# Patient Record
Sex: Female | Born: 1985 | Race: White | Hispanic: No | Marital: Married | Smoking: Current every day smoker
Health system: Southern US, Community
[De-identification: ages and names within clinical notes are randomized; demographics above are authoritative.]

## PROBLEM LIST (undated history)

## (undated) DIAGNOSIS — T8859XA Other complications of anesthesia, initial encounter: Secondary | ICD-10-CM

## (undated) DIAGNOSIS — T4145XA Adverse effect of unspecified anesthetic, initial encounter: Secondary | ICD-10-CM

## (undated) DIAGNOSIS — F1111 Opioid abuse, in remission: Secondary | ICD-10-CM

## (undated) DIAGNOSIS — S82892A Other fracture of left lower leg, initial encounter for closed fracture: Secondary | ICD-10-CM

## (undated) DIAGNOSIS — F410 Panic disorder [episodic paroxysmal anxiety] without agoraphobia: Secondary | ICD-10-CM

## (undated) DIAGNOSIS — R519 Headache, unspecified: Secondary | ICD-10-CM

## (undated) DIAGNOSIS — Z972 Presence of dental prosthetic device (complete) (partial): Secondary | ICD-10-CM

## (undated) DIAGNOSIS — F419 Anxiety disorder, unspecified: Secondary | ICD-10-CM

## (undated) DIAGNOSIS — M25572 Pain in left ankle and joints of left foot: Secondary | ICD-10-CM

## (undated) DIAGNOSIS — M25472 Effusion, left ankle: Secondary | ICD-10-CM

## (undated) DIAGNOSIS — R51 Headache: Secondary | ICD-10-CM

## (undated) HISTORY — PX: WISDOM TOOTH EXTRACTION: SHX21

---

## 2005-07-15 ENCOUNTER — Ambulatory Visit (HOSPITAL_COMMUNITY): Admission: RE | Admit: 2005-07-15 | Discharge: 2005-07-15 | Payer: Self-pay | Admitting: Gynecology

## 2005-07-31 ENCOUNTER — Ambulatory Visit (HOSPITAL_COMMUNITY): Admission: RE | Admit: 2005-07-31 | Discharge: 2005-07-31 | Payer: Self-pay | Admitting: Gynecology

## 2005-08-04 HISTORY — PX: TUBAL LIGATION: SHX77

## 2005-09-30 ENCOUNTER — Emergency Department: Payer: Self-pay | Admitting: Emergency Medicine

## 2005-12-08 ENCOUNTER — Observation Stay: Payer: Self-pay

## 2005-12-09 ENCOUNTER — Observation Stay: Payer: Self-pay | Admitting: Unknown Physician Specialty

## 2005-12-13 ENCOUNTER — Inpatient Hospital Stay: Payer: Self-pay | Admitting: Obstetrics & Gynecology

## 2006-02-05 ENCOUNTER — Ambulatory Visit: Payer: Self-pay | Admitting: Obstetrics & Gynecology

## 2006-10-12 ENCOUNTER — Ambulatory Visit: Payer: Self-pay | Admitting: Obstetrics & Gynecology

## 2006-10-15 ENCOUNTER — Ambulatory Visit: Payer: Self-pay | Admitting: Obstetrics & Gynecology

## 2007-01-19 ENCOUNTER — Ambulatory Visit: Payer: Self-pay | Admitting: Obstetrics & Gynecology

## 2008-10-22 ENCOUNTER — Emergency Department: Payer: Self-pay | Admitting: Emergency Medicine

## 2009-01-30 ENCOUNTER — Encounter: Payer: Self-pay | Admitting: General Practice

## 2009-02-01 ENCOUNTER — Encounter: Payer: Self-pay | Admitting: General Practice

## 2009-07-29 ENCOUNTER — Emergency Department: Payer: Self-pay | Admitting: Emergency Medicine

## 2010-07-04 ENCOUNTER — Emergency Department: Payer: Self-pay | Admitting: Emergency Medicine

## 2010-09-25 ENCOUNTER — Emergency Department: Payer: Self-pay | Admitting: Unknown Physician Specialty

## 2010-10-18 ENCOUNTER — Emergency Department: Payer: Self-pay | Admitting: Emergency Medicine

## 2011-01-08 ENCOUNTER — Encounter: Payer: Self-pay | Admitting: Internal Medicine

## 2011-03-02 ENCOUNTER — Emergency Department: Payer: Self-pay | Admitting: Emergency Medicine

## 2011-07-13 ENCOUNTER — Emergency Department: Payer: Self-pay | Admitting: Emergency Medicine

## 2012-10-10 ENCOUNTER — Emergency Department: Payer: Self-pay | Admitting: Emergency Medicine

## 2012-10-10 LAB — URINALYSIS, COMPLETE
Bilirubin,UR: NEGATIVE
Blood: NEGATIVE
Ketone: NEGATIVE
Ph: 5 (ref 4.5–8.0)
RBC,UR: 5 /HPF (ref 0–5)
Squamous Epithelial: 10
WBC UR: 5 /HPF (ref 0–5)

## 2012-10-10 LAB — COMPREHENSIVE METABOLIC PANEL
Albumin: 4 g/dL (ref 3.4–5.0)
Anion Gap: 7 (ref 7–16)
Bilirubin,Total: 0.4 mg/dL (ref 0.2–1.0)
Calcium, Total: 8.4 mg/dL — ABNORMAL LOW (ref 8.5–10.1)
Co2: 27 mmol/L (ref 21–32)
Osmolality: 278 (ref 275–301)
SGPT (ALT): 22 U/L (ref 12–78)
Sodium: 138 mmol/L (ref 136–145)

## 2012-10-10 LAB — CBC
MCHC: 34.8 g/dL (ref 32.0–36.0)
MCV: 84 fL (ref 80–100)
RBC: 4.87 10*6/uL (ref 3.80–5.20)
WBC: 8.3 10*3/uL (ref 3.6–11.0)

## 2012-10-10 LAB — LIPASE, BLOOD: Lipase: 65 U/L — ABNORMAL LOW (ref 73–393)

## 2012-10-12 ENCOUNTER — Emergency Department: Payer: Self-pay | Admitting: Emergency Medicine

## 2012-10-12 LAB — URINALYSIS, COMPLETE
Bilirubin,UR: NEGATIVE
Glucose,UR: NEGATIVE mg/dL (ref 0–75)
Hyaline Cast: 10
Ketone: NEGATIVE
Ph: 5 (ref 4.5–8.0)
Protein: 30
Specific Gravity: 1.026 (ref 1.003–1.030)
Squamous Epithelial: 15
WBC UR: 6 /HPF (ref 0–5)

## 2012-10-14 LAB — URINE CULTURE

## 2013-02-03 ENCOUNTER — Ambulatory Visit: Payer: Self-pay | Admitting: Obstetrics & Gynecology

## 2013-02-15 ENCOUNTER — Emergency Department: Payer: Self-pay | Admitting: Emergency Medicine

## 2013-02-19 ENCOUNTER — Emergency Department: Payer: Self-pay | Admitting: Emergency Medicine

## 2013-02-23 ENCOUNTER — Emergency Department: Payer: Self-pay | Admitting: Emergency Medicine

## 2013-04-21 ENCOUNTER — Emergency Department: Payer: Self-pay | Admitting: Emergency Medicine

## 2013-05-21 ENCOUNTER — Emergency Department: Payer: Self-pay | Admitting: Internal Medicine

## 2013-08-14 ENCOUNTER — Emergency Department: Payer: Self-pay | Admitting: Emergency Medicine

## 2013-11-07 ENCOUNTER — Emergency Department: Payer: Self-pay | Admitting: Emergency Medicine

## 2013-12-04 ENCOUNTER — Emergency Department: Payer: Self-pay | Admitting: Emergency Medicine

## 2013-12-07 ENCOUNTER — Emergency Department: Payer: Self-pay | Admitting: Emergency Medicine

## 2013-12-18 ENCOUNTER — Emergency Department: Payer: Self-pay | Admitting: Emergency Medicine

## 2014-01-02 ENCOUNTER — Emergency Department: Payer: Self-pay | Admitting: Emergency Medicine

## 2014-01-08 ENCOUNTER — Emergency Department: Payer: Self-pay | Admitting: Emergency Medicine

## 2014-01-16 ENCOUNTER — Emergency Department: Payer: Self-pay | Admitting: Emergency Medicine

## 2014-01-17 ENCOUNTER — Emergency Department: Payer: Self-pay | Admitting: Emergency Medicine

## 2014-01-25 ENCOUNTER — Emergency Department: Payer: Self-pay | Admitting: Emergency Medicine

## 2014-02-28 ENCOUNTER — Emergency Department: Payer: Self-pay | Admitting: Internal Medicine

## 2014-03-02 ENCOUNTER — Emergency Department: Payer: Self-pay | Admitting: Emergency Medicine

## 2014-03-13 ENCOUNTER — Emergency Department: Payer: Self-pay | Admitting: Emergency Medicine

## 2014-06-21 ENCOUNTER — Emergency Department: Payer: Self-pay | Admitting: Emergency Medicine

## 2014-07-05 ENCOUNTER — Emergency Department: Payer: Self-pay | Admitting: Emergency Medicine

## 2014-08-04 HISTORY — PX: LEG SURGERY: SHX1003

## 2014-08-17 ENCOUNTER — Emergency Department: Payer: Self-pay | Admitting: Emergency Medicine

## 2014-08-17 LAB — URINALYSIS, COMPLETE
Bilirubin,UR: NEGATIVE
Glucose,UR: NEGATIVE mg/dL (ref 0–75)
KETONE: NEGATIVE
Nitrite: NEGATIVE
PH: 5 (ref 4.5–8.0)
Specific Gravity: 1.026 (ref 1.003–1.030)
Squamous Epithelial: 40
WBC UR: 2 /HPF (ref 0–5)

## 2014-08-17 LAB — GC/CHLAMYDIA PROBE AMP

## 2014-08-17 LAB — WET PREP, GENITAL

## 2014-08-19 LAB — URINE CULTURE

## 2014-09-18 ENCOUNTER — Emergency Department: Payer: Self-pay | Admitting: Emergency Medicine

## 2014-09-22 ENCOUNTER — Emergency Department: Payer: Self-pay | Admitting: Emergency Medicine

## 2014-09-27 ENCOUNTER — Emergency Department: Payer: Self-pay | Admitting: Student

## 2014-11-28 ENCOUNTER — Emergency Department: Admit: 2014-11-28 | Disposition: A | Discharge status: Home / Self Care | Payer: Self-pay | Admitting: Internal Medicine

## 2015-04-13 ENCOUNTER — Emergency Department
Admission: EM | Admit: 2015-04-13 | Discharge: 2015-04-13 | Disposition: A | Discharge status: Home / Self Care | Payer: Medicaid Other | Attending: Emergency Medicine | Admitting: Emergency Medicine

## 2015-04-13 ENCOUNTER — Emergency Department: Payer: Medicaid Other

## 2015-04-13 ENCOUNTER — Encounter: Payer: Self-pay | Admitting: *Deleted

## 2015-04-13 DIAGNOSIS — Y9289 Other specified places as the place of occurrence of the external cause: Secondary | ICD-10-CM | POA: Insufficient documentation

## 2015-04-13 DIAGNOSIS — Y9389 Activity, other specified: Secondary | ICD-10-CM | POA: Diagnosis not present

## 2015-04-13 DIAGNOSIS — Y998 Other external cause status: Secondary | ICD-10-CM | POA: Insufficient documentation

## 2015-04-13 DIAGNOSIS — S80211A Abrasion, right knee, initial encounter: Secondary | ICD-10-CM | POA: Diagnosis not present

## 2015-04-13 DIAGNOSIS — W08XXXA Fall from other furniture, initial encounter: Secondary | ICD-10-CM | POA: Diagnosis not present

## 2015-04-13 DIAGNOSIS — S99912A Unspecified injury of left ankle, initial encounter: Secondary | ICD-10-CM | POA: Diagnosis present

## 2015-04-13 DIAGNOSIS — S82892A Other fracture of left lower leg, initial encounter for closed fracture: Secondary | ICD-10-CM

## 2015-04-13 DIAGNOSIS — S8262XA Displaced fracture of lateral malleolus of left fibula, initial encounter for closed fracture: Secondary | ICD-10-CM | POA: Insufficient documentation

## 2015-04-13 MED ORDER — KETOROLAC TROMETHAMINE 10 MG PO TABS: 10.0000 mg | ORAL_TABLET | Freq: Once | ORAL | Status: DC

## 2015-04-13 MED ORDER — TRAMADOL HCL 50 MG PO TABS: 50.0000 mg | ORAL_TABLET | Freq: Once | ORAL | Status: DC

## 2015-04-13 MED ORDER — TRAMADOL HCL 50 MG PO TABS
50.0000 mg | ORAL_TABLET | Freq: Four times a day (QID) | ORAL | Status: DC | PRN
Start: 2015-04-13 — End: 2015-06-07

## 2015-04-13 MED ORDER — KETOROLAC TROMETHAMINE 60 MG/2ML IM SOLN
60.0000 mg | Freq: Once | INTRAMUSCULAR | Status: AC
  Administered 2015-04-13: 60 mg via INTRAMUSCULAR
  Filled 2015-04-13: qty 2

## 2015-04-13 MED ORDER — KETOROLAC TROMETHAMINE 10 MG PO TABS: 10.0000 mg | ORAL_TABLET | Freq: Four times a day (QID) | ORAL | Status: DC | PRN

## 2015-04-13 MED ORDER — HYDROMORPHONE HCL 1 MG/ML IJ SOLN
1.0000 mg | Freq: Once | INTRAMUSCULAR | Status: AC
  Administered 2015-04-13: 1 mg via INTRAMUSCULAR
  Filled 2015-04-13: qty 1

## 2015-04-13 NOTE — Discharge Instructions (Signed)
Wear splint and ambualte with crutches pending Ortho evaluation.

## 2015-04-13 NOTE — ED Notes (Signed)
Pt reports falling off bleachers injuring left ankle.  Notable swelling to left ankle.  Abrasion to right knee.  Pt states unable to bear wt.  Pt NAD at this time.

## 2015-04-13 NOTE — ED Provider Notes (Signed)
[facility] Emergency Department Provider Note  ____________________________________________  Time seen: Approximately 10:32 PM  I have reviewed the triage vital signs and the nursing notes.   HISTORY  Chief Complaint Ankle Pain    HPI Regina Mays is a 29 y.o. female patient complaining of left ankle pain and edema secondary to falling off some bleachers. Patient also has abrasion to the right knee. Patient states she is unable to weight-bear secondary to the pain of the ankle. No palliative measures taken for this complaint.He is rating the pain as a 10 over 10. Patient described the pain as sharp.   History reviewed. No pertinent past medical history.  There are no active problems to display for this patient.   History reviewed. No pertinent past surgical history.  No current outpatient prescriptions on file.  Allergies Hydrocodone  No family history on file.  Social History Social History  Substance Use Topics  . Smoking status: Never Smoker   . Smokeless tobacco: None  . Alcohol Use: No    Review of Systems Constitutional: No fever/chills Eyes: No visual changes. ENT: No sore throat. Cardiovascular: Denies chest pain. Respiratory: Denies shortness of breath. Gastrointestinal: No abdominal pain.  No nausea, no vomiting.  No diarrhea.  No constipation. Genitourinary: Negative for dysuria. Musculoskeletal: Pain and edema to the left ankle. Skin: Negative for rash. Abrasion to right knee Neurological: Negative for headaches, focal weakness or numbness. : Allergic/Immunilogical: Hydrocodone  10-point ROS otherwise negative.  ____________________________________________   PHYSICAL EXAM:  VITAL SIGNS: ED Triage Vitals  Enc Vitals Group     BP 04/13/15 2205 111/75 mmHg     Pulse Rate 04/13/15 2205 74     Resp 04/13/15 2205 18     Temp 04/13/15 2205 98.7 F (37.1 C)     Temp Source 04/13/15 2205 Oral     SpO2 04/13/15 2205  100 %     Weight 04/13/15 2205 162 lb (73.483 kg)     Height 04/13/15 2205  (1.778 m)     Head Cir --      Peak Flow --      Pain Score 04/13/15 2206 10     Pain Loc --      Pain Edu? --      Excl. in GC? --     Constitutional: Alert and oriented. Well appearing and in no acute distress. Eyes: Conjunctivae are normal. PERRL. EOMI. Head: Atraumatic. Nose: No congestion/rhinnorhea. Mouth/Throat: Mucous membranes are moist.  Oropharynx non-erythematous. Neck: No stridor.   Hematological/Lymphatic/Immunilogical: No cervical lymphadenopathy. Cardiovascular: Normal rate, regular rhythm. Grossly normal heart sounds.  Good peripheral circulation. Respiratory: Normal respiratory effort.  No retractions. Lungs CTAB. Gastrointestinal: Soft and nontender. No distention. No abdominal bruits. No CVA tenderness. Musculoskeletal: Obvious edema to the lateral aspect left ankle . Vascular intact decreased range of motion's secondary to pain unable to bear weight.  Neurologic:  Normal speech and language. No gross focal neurologic deficits are appreciated.  Skin:  Skin is warm, dry and intact. No rash noted. Abrasion right knee Psychiatric: Mood and affect are normal. Speech and behavior are normal.  ____________________________________________   LABS (all labs ordered are listed, but only abnormal results are displayed)  Labs Reviewed - No data to display ____________________________________________  EKG   ____________________________________________  RADIOLOGY  X-ray reveal a chronic non-displace nonunited lateral malleolus fracture does not change from the x-ray taken on 07/05/2014. Patient also have a small avulsion arising from the tip of the  lateral malleolus there is moderate soft tissue edema. I, Joni Reining, personally viewed and evaluated these images (plain radiographs) as part of my medical decision making.    ____________________________________________   PROCEDURES  Procedure(s) performed: None  Critical Care performed: No  ____________________________________________   INITIAL IMPRESSION / ASSESSMENT AND PLAN / ED COURSE  Pertinent labs & imaging results that were available during my care of the patient were reviewed by me and considered in my medical decision making (see chart for details).  Small avulsion fracture off the lateral malleolus of the left ankle. Chronic lateral malleolus fracture. Patient will be placed in a posterior ankle splint and given crutches for ambulation. Patient advised to follow orthopedics Department in 2-3 days. ____________________________________________   FINAL CLINICAL IMPRESSION(S) / ED DIAGNOSES  Final diagnoses:  Avulsion fracture of left ankle, closed, initial encounter      Joni Reining, PA-C 04/13/15 2246  Myrna Blazer, MD 04/14/15 [phone]

## 2015-04-13 NOTE — ED Notes (Signed)
Pt states when she jumped off the bleachers to stand up she fell down and felt pain in her left ankle. Pain and swelling to the left ankle. Hx of fracture to the same ankle without repair in 2014.

## 2015-06-07 ENCOUNTER — Encounter (HOSPITAL_BASED_OUTPATIENT_CLINIC_OR_DEPARTMENT_OTHER): Payer: Self-pay | Admitting: *Deleted

## 2015-06-07 SURGERY — Surgical Case
Anesthesia: *Unknown

## 2015-06-07 NOTE — Progress Notes (Signed)
NPO AFTER MN.  ARRIVE AT 0700. NEEDS HG. MAY TAKE PERCOCET AM DOS W/ SIPS OF WATER.

## 2015-06-14 ENCOUNTER — Ambulatory Visit (HOSPITAL_BASED_OUTPATIENT_CLINIC_OR_DEPARTMENT_OTHER): Admission: RE | Admit: 2015-06-14 | Payer: Medicaid Other | Source: Ambulatory Visit | Admitting: Podiatry

## 2015-06-14 HISTORY — DX: Effusion, left ankle: M25.472

## 2015-06-14 HISTORY — DX: Other complications of anesthesia, initial encounter: T88.59XA

## 2015-06-14 HISTORY — DX: Other fracture of left lower leg, initial encounter for closed fracture: S82.892A

## 2015-06-14 HISTORY — DX: Adverse effect of unspecified anesthetic, initial encounter: T41.45XA

## 2015-06-14 HISTORY — DX: Anxiety disorder, unspecified: F41.9

## 2015-06-14 HISTORY — DX: Pain in left ankle and joints of left foot: M25.572

## 2015-06-14 SURGERY — ARTHRODESIS ANKLE
Anesthesia: Choice | Site: Ankle | Laterality: Left

## 2015-06-20 DIAGNOSIS — F3340 Major depressive disorder, recurrent, in remission, unspecified: Secondary | ICD-10-CM

## 2015-06-20 DIAGNOSIS — S8262XK Displaced fracture of lateral malleolus of left fibula, subsequent encounter for closed fracture with nonunion: Secondary | ICD-10-CM | POA: Insufficient documentation

## 2015-06-20 DIAGNOSIS — F909 Attention-deficit hyperactivity disorder, unspecified type: Secondary | ICD-10-CM | POA: Insufficient documentation

## 2015-06-20 HISTORY — DX: Major depressive disorder, recurrent, in remission, unspecified: F33.40

## 2015-06-20 HISTORY — DX: Displaced fracture of lateral malleolus of left fibula, subsequent encounter for closed fracture with nonunion: S82.62XK

## 2015-06-22 DIAGNOSIS — Z765 Malingerer [conscious simulation]: Secondary | ICD-10-CM

## 2015-06-22 HISTORY — DX: Malingerer (conscious simulation): Z76.5

## 2015-08-13 ENCOUNTER — Encounter: Payer: Self-pay | Admitting: *Deleted

## 2015-08-13 ENCOUNTER — Emergency Department
Admission: EM | Admit: 2015-08-13 | Discharge: 2015-08-13 | Disposition: A | Discharge status: Home / Self Care | Payer: Medicaid Other | Attending: Emergency Medicine | Admitting: Emergency Medicine

## 2015-08-13 DIAGNOSIS — Y9289 Other specified places as the place of occurrence of the external cause: Secondary | ICD-10-CM | POA: Diagnosis not present

## 2015-08-13 DIAGNOSIS — S8012XA Contusion of left lower leg, initial encounter: Secondary | ICD-10-CM

## 2015-08-13 DIAGNOSIS — Z87891 Personal history of nicotine dependence: Secondary | ICD-10-CM | POA: Insufficient documentation

## 2015-08-13 DIAGNOSIS — Y9389 Activity, other specified: Secondary | ICD-10-CM | POA: Diagnosis not present

## 2015-08-13 DIAGNOSIS — G8918 Other acute postprocedural pain: Secondary | ICD-10-CM | POA: Diagnosis not present

## 2015-08-13 DIAGNOSIS — W108XXA Fall (on) (from) other stairs and steps, initial encounter: Secondary | ICD-10-CM | POA: Diagnosis not present

## 2015-08-13 DIAGNOSIS — S79922A Unspecified injury of left thigh, initial encounter: Secondary | ICD-10-CM | POA: Insufficient documentation

## 2015-08-13 DIAGNOSIS — Z880 Allergy status to penicillin: Secondary | ICD-10-CM | POA: Insufficient documentation

## 2015-08-13 DIAGNOSIS — Y998 Other external cause status: Secondary | ICD-10-CM | POA: Insufficient documentation

## 2015-08-13 DIAGNOSIS — S8992XA Unspecified injury of left lower leg, initial encounter: Secondary | ICD-10-CM | POA: Diagnosis present

## 2015-08-13 MED ORDER — HYDROMORPHONE HCL 1 MG/ML IJ SOLN
INTRAMUSCULAR | Status: AC
  Administered 2015-08-13: 1 mg via INTRAVENOUS
  Filled 2015-08-13: qty 1

## 2015-08-13 MED ORDER — OXYCODONE-ACETAMINOPHEN 5-325 MG PO TABS: 1.0000 | ORAL_TABLET | ORAL | Status: DC | PRN

## 2015-08-13 MED ORDER — OXYCODONE HCL 5 MG PO TABS
10.0000 mg | ORAL_TABLET | Freq: Once | ORAL | Status: AC
  Administered 2015-08-13: 10 mg via ORAL

## 2015-08-13 MED ORDER — OXYCODONE HCL 5 MG PO TABS
ORAL_TABLET | ORAL | Status: AC
  Administered 2015-08-13: 10 mg via ORAL
  Filled 2015-08-13: qty 2

## 2015-08-13 MED ORDER — OXYCODONE HCL 5 MG PO TABS: ORAL_TABLET | ORAL | Status: DC

## 2015-08-13 MED ORDER — ONDANSETRON HCL 4 MG/2ML IJ SOLN
4.0000 mg | Freq: Once | INTRAMUSCULAR | Status: AC
  Administered 2015-08-13: 4 mg via INTRAVENOUS
  Filled 2015-08-13: qty 2

## 2015-08-13 MED ORDER — HYDROMORPHONE HCL 1 MG/ML IJ SOLN
1.0000 mg | Freq: Once | INTRAMUSCULAR | Status: AC
  Administered 2015-08-13: 1 mg via INTRAVENOUS
  Filled 2015-08-13: qty 1

## 2015-08-13 MED ORDER — HYDROMORPHONE HCL 1 MG/ML IJ SOLN
1.0000 mg | Freq: Once | INTRAMUSCULAR | Status: AC
  Administered 2015-08-13: 1 mg via INTRAVENOUS

## 2015-08-13 NOTE — Discharge Instructions (Signed)
You were treated for postoperative pain and pain due to contusion of the leg. Return to the department for any worsening condition including new or worsening pain, new weakness or numbness.   Do not take more oxycodone than prescribed, or mix with any sedating medications or drinks including alcohol.  Contusion A contusion is a deep bruise. Contusions are the result of a blunt injury to tissues and muscle fibers under the skin. The injury causes bleeding under the skin. The skin overlying the contusion may turn blue, purple, or yellow. Minor injuries will give you a painless contusion, but more severe contusions may stay painful and swollen for a few weeks.  CAUSES  This condition is usually caused by a blow, trauma, or direct force to an area of the body. SYMPTOMS  Symptoms of this condition include:  Swelling of the injured area.  Pain and tenderness in the injured area.  Discoloration. The area may have redness and then turn blue, purple, or yellow. DIAGNOSIS  This condition is diagnosed based on a physical exam and medical history. An X-ray, CT scan, or MRI may be needed to determine if there are any associated injuries, such as broken bones (fractures). TREATMENT  Specific treatment for this condition depends on what area of the body was injured. In general, the best treatment for a contusion is resting, icing, applying pressure to (compression), and elevating the injured area. This is often called the RICE strategy. Over-the-counter anti-inflammatory medicines may also be recommended for pain control.  HOME CARE INSTRUCTIONS   Rest the injured area.  If directed, apply ice to the injured area:  Put ice in a plastic bag.  Place a towel between your skin and the bag.  Leave the ice on for 20 minutes, 2-3 times per day.  If directed, apply light compression to the injured area using an elastic bandage. Make sure the bandage is not wrapped too tightly. Remove and reapply the  bandage as directed by your health care provider.  If possible, raise (elevate) the injured area above the level of your heart while you are sitting or lying down.  Take over-the-counter and prescription medicines only as told by your health care provider. SEEK MEDICAL CARE IF:  Your symptoms do not improve after several days of treatment.  Your symptoms get worse.  You have difficulty moving the injured area. SEEK IMMEDIATE MEDICAL CARE IF:   You have severe pain.  You have numbness in a hand or foot.  Your hand or foot turns pale or cold.   This information is not intended to replace advice given to you by your health care provider. Make sure you discuss any questions you have with your health care provider.   Document Released: 04/30/2005 Document Revised: 04/11/2015 Document Reviewed: 12/06/2014 Elsevier Interactive Patient Education [employer]2016 [employer]vier Inc.

## 2015-08-13 NOTE — ED Notes (Signed)
Patient states she had left LE surgery last Tuesday. Today, patient states she fell on the stairs and incision began to bleed. Patient states she spoke with her surgeon who sent her here for pain control and to see if damage was done.

## 2015-08-13 NOTE — ED Provider Notes (Signed)
[facility] Emergency Department Provider Note   ____________________________________________  Time seen:  I have reviewed the triage vital signs and the triage nursing note.  HISTORY  Chief Complaint Fall   Historian Patient  HPI Regina Mays is a 30 y.o. female who on last Tuesday [facility], who fell on steps today and is having increased pain from her thigh all the way down her leg. She is out of her pain pills which was Percocet. She called her orthopedic physician who told her to come to the ED for evaluation. She states she would've gone down to do, however with the one or strong weather, decided to come to the closest emergency department. Pain is moderate to severe.    Past Medical History  Diagnosis Date  . Anxiety   . Complication of anesthesia     "fear not waking up"  . Closed left ankle fracture     original injury 2014--- reinjured 09/ 2016  . Left ankle pain   . Left ankle swelling     There are no active problems to display for this patient.   Past Surgical History  Procedure Laterality Date  . Tubal ligation  2007  . Leg surgery Left     Current Outpatient Rx  Name  Route  Sig  Dispense  Refill  . oxyCODONE (ROXICODONE) 5 MG immediate release tablet      1-2 Tabs po q6h prn severe pain   30 tablet   0     Allergies Penicillins; Hydrocodone; and Tramadol  No family history on file.  Social History Social History  Substance Use Topics  . Smoking status: Former Smoker -- 4 years    Types: Cigarettes    Quit date: 06/06/2014  . Smokeless tobacco: Never Used  . Alcohol Use: No    Review of Systems  Constitutional: Negative for fever. Eyes: Negative for visual changes. ENT: Negative for sore throat. Cardiovascular: Negative for chest pain. Respiratory: Negative for shortness of breath. Gastrointestinal: Negative for abdominal pain, vomiting and diarrhea. Genitourinary: Negative for  dysuria. Musculoskeletal: Negative for back pain. Skin: Negative for rash. Neurological: Negative for headache. 10 point Review of Systems otherwise negative ____________________________________________   PHYSICAL EXAM:  VITAL SIGNS: ED Triage Vitals  Enc Vitals Group     BP 08/13/15 1925 115/60 mmHg     Pulse Rate 08/13/15 1925 96     Resp 08/13/15 1925 18     Temp 08/13/15 1925 98.1 F (36.7 C)     Temp Source 08/13/15 1925 Oral     SpO2 08/13/15 1925 100 %     Weight 08/13/15 1925 167 lb (75.751 kg)     Height 08/13/15 1925 5\' 1"  (1.549 m)     Head Cir --      Peak Flow --      Pain Score 08/13/15 1928 10     Pain Loc --      Pain Edu? --      Excl. in GC? --      Constitutional: Alert and oriented. Well appearing and overall but crying in pain. Eyes: Conjunctivae are normal. PERRL. Normal extraocular movements. ENT   Head: Normocephalic and atraumatic.   Nose: No congestion/rhinnorhea.   Mouth/Throat: Mucous membranes are moist.   Neck: No stridor. Cardiovascular/Chest: Normal rate, regular rhythm.  No murmurs, rubs, or gallops. Respiratory: Normal respiratory effort without tachypnea nor retractions. Breath sounds are clear and equal bilaterally. No wheezes/rales/rhonchi. Gastrointestinal:  Soft. No distention, no guarding, no rebound. Nontender.    Genitourinary/rectal:Deferred Musculoskeletal: Left lateral ankle swelling with old ecchymosis, with stitches clean dry and intact Neurologic:  Normal speech and language. No gross or focal neurologic deficits are appreciated. Skin:  Skin is warm, dry and intact. No rash noted. Psychiatric: Mood and affect are normal. Speech and behavior are normal. Patient exhibits appropriate insight and judgment.  ____________________________________________   EKG I, Governor Rooks, MD, the attending physician have personally viewed and interpreted all ECGs.  None ____________________________________________  LABS  (pertinent positives/negatives)  None  ____________________________________________  RADIOLOGY All Xrays were viewed by me. Imaging interpreted by Radiologist.  none __________________________________________  PROCEDURES  Procedure(s) performed: None  Critical Care performed: None  ____________________________________________   ED COURSE / ASSESSMENT AND PLAN  CONSULTATIONS: None   Pertinent labs & imaging results that were available during my care of the patient were reviewed by me and considered in my medical decision making (see chart for details).  Patient was given IV symptomatic pain control, and this was successful.  I was able to take the patient down and take a look at the incision site which is clean dry and intact. There is still swelling there, but it all appears related to postsurgical, rather than new injury as she was wearing the hard plastic boot when she fell.  I will refill the Roxicodone for her acute postoperative and contusion pain. Her appointment is on Tuesday for follow-up with her orthopedic surgeon.    Patient / Family / Caregiver informed of clinical course, medical decision-making process, and agree with plan.   I discussed return precautions, follow-up instructions, and discharged instructions with patient and/or family.  ___________________________________________   FINAL CLINICAL IMPRESSION(S) / ED DIAGNOSES   Final diagnoses:  Contusion of left leg, initial encounter  Postoperative pain of extremity              Note: This dictation was prepared with Dragon dictation. Any transcriptional errors that result from this process are unintentional   Governor Rooks, MD 08/13/15 2228

## 2015-08-13 NOTE — ED Notes (Signed)
Patient moved from ED 41 to ED 7 following PA assessment. Patient requires assessment and evaluation by MD per Katrinka BlazingSmith, PA-C

## 2015-08-13 NOTE — ED Notes (Signed)
AAOx3.  Skin warm and dry.  NAD 

## 2015-10-07 ENCOUNTER — Encounter: Payer: Self-pay | Admitting: *Deleted

## 2015-10-07 ENCOUNTER — Emergency Department
Admission: EM | Admit: 2015-10-07 | Discharge: 2015-10-07 | Disposition: A | Discharge status: Home / Self Care | Payer: Medicaid Other | Attending: Emergency Medicine | Admitting: Emergency Medicine

## 2015-10-07 DIAGNOSIS — J111 Influenza due to unidentified influenza virus with other respiratory manifestations: Secondary | ICD-10-CM | POA: Diagnosis not present

## 2015-10-07 DIAGNOSIS — F17210 Nicotine dependence, cigarettes, uncomplicated: Secondary | ICD-10-CM | POA: Insufficient documentation

## 2015-10-07 NOTE — ED Notes (Signed)
Pt reports she is flu positive.  Pt [facility] dx at [facility].  Pt is taking tamiflu.  Pt states i'm not any better.  Pt has a cough and bodyaches.

## 2015-10-25 DIAGNOSIS — G8929 Other chronic pain: Secondary | ICD-10-CM | POA: Insufficient documentation

## 2015-10-25 HISTORY — DX: Other chronic pain: G89.29

## 2016-04-27 ENCOUNTER — Emergency Department: Payer: Medicaid Other

## 2016-04-27 ENCOUNTER — Emergency Department
Admission: EM | Admit: 2016-04-27 | Discharge: 2016-04-27 | Disposition: A | Discharge status: Home / Self Care | Payer: Medicaid Other | Attending: Emergency Medicine | Admitting: Emergency Medicine

## 2016-04-27 DIAGNOSIS — Y999 Unspecified external cause status: Secondary | ICD-10-CM | POA: Insufficient documentation

## 2016-04-27 DIAGNOSIS — X501XXA Overexertion from prolonged static or awkward postures, initial encounter: Secondary | ICD-10-CM | POA: Diagnosis not present

## 2016-04-27 DIAGNOSIS — Y9389 Activity, other specified: Secondary | ICD-10-CM | POA: Insufficient documentation

## 2016-04-27 DIAGNOSIS — Y9289 Other specified places as the place of occurrence of the external cause: Secondary | ICD-10-CM | POA: Diagnosis not present

## 2016-04-27 DIAGNOSIS — S93402A Sprain of unspecified ligament of left ankle, initial encounter: Secondary | ICD-10-CM

## 2016-04-27 DIAGNOSIS — F17210 Nicotine dependence, cigarettes, uncomplicated: Secondary | ICD-10-CM | POA: Diagnosis not present

## 2016-04-27 DIAGNOSIS — M25572 Pain in left ankle and joints of left foot: Secondary | ICD-10-CM | POA: Diagnosis present

## 2016-04-27 NOTE — ED Provider Notes (Signed)
[facility] Emergency Department Provider Note   ____________________________________________   First MD Initiated Contact with Patient 04/27/16 0404     (approximate)  I have reviewed the triage vital signs and the nursing notes.   HISTORY  Chief Complaint Ankle Pain    HPI Regina Mays is a 30 y.o. female who presents to the ED from home with a chief complaint of left ankle pain. Reports falling on steps 3 days ago, twisting her left ankle. Has had prior surgery to the same ankle. Denies striking head or LOC. Denies recent fever, chills, chest pain, shortness of breath, abdominal pain, nausea, vomiting, diarrhea. Nothing makes her pain better. Movement and bearing weight makes her pain worse.   Past Medical History:  Diagnosis Date  . Anxiety   . Closed left ankle fracture    original injury 2014--- reinjured 09/ 2016  . Complication of anesthesia    "fear not waking up"  . Left ankle pain   . Left ankle swelling     There are no active problems to display for this patient.   Past Surgical History:  Procedure Laterality Date  . LEG SURGERY Left   . TUBAL LIGATION  2007    Prior to Admission medications   Medication Sig Start Date End Date Taking? Authorizing Provider  oxyCODONE (ROXICODONE) 5 MG immediate release tablet 1-2 Tabs po q6h prn severe pain 08/13/15   Governor Rooks, MD    Allergies Penicillins; Hydrocodone; Toradol [ketorolac tromethamine]; and Tramadol  No family history on file.  Social History Social History  Substance Use Topics  . Smoking status: Current Every Day Smoker    Years: 4.00    Types: Cigarettes    Last attempt to quit: 06/06/2014  . Smokeless tobacco: Never Used  . Alcohol use No    Review of Systems  Constitutional: No fever/chills. Eyes: No visual changes. ENT: No sore throat. Cardiovascular: Denies chest pain. Respiratory: Denies shortness of breath. Gastrointestinal: No abdominal pain.   No nausea, no vomiting.  No diarrhea.  No constipation. Genitourinary: Negative for dysuria. Musculoskeletal: Positive for left ankle pain. Negative for back pain. Skin: Negative for rash. Neurological: Negative for headaches, focal weakness or numbness.  10-point ROS otherwise negative.  ____________________________________________   PHYSICAL EXAM:  VITAL SIGNS: ED Triage Vitals  Enc Vitals Group     BP 04/27/16 0208 117/79     Pulse Rate 04/27/16 0208 73     Resp 04/27/16 0208 20     Temp 04/27/16 0208 97.8 F (36.6 C)     Temp Source 04/27/16 0208 Oral     SpO2 04/27/16 0208 100 %     Weight 04/27/16 0207 165 lb (74.8 kg)     Height 04/27/16 0207 5\' 1"  (1.549 m)     Head Circumference --      Peak Flow --      Pain Score 04/27/16 0402 9     Pain Loc --      Pain Edu? --      Excl. in GC? --     Constitutional: Alert and oriented. Well appearing and in no acute distress. Eyes: Conjunctivae are normal. PERRL. EOMI. Head: Atraumatic. Nose: No congestion/rhinnorhea. Mouth/Throat: Mucous membranes are moist.  Oropharynx non-erythematous. Neck: No stridor.  No cervical spine tenderness to palpation. Cardiovascular: Normal rate, regular rhythm. Grossly normal heart sounds.  Good peripheral circulation. Respiratory: Normal respiratory effort.  No retractions. Lungs CTAB. Gastrointestinal: Soft and nontender. No distention. No  abdominal bruits. No CVA tenderness. Musculoskeletal: Left lateral ankle tender to palpation. No joint effusions. Limited range of motion secondary to pain. Neurologic:  Normal speech and language. No gross focal neurologic deficits are appreciated.  Skin:  Skin is warm, dry and intact. No rash noted. Psychiatric: Mood and affect are normal. Speech and behavior are normal.  ____________________________________________   LABS (all labs ordered are listed, but only abnormal results are displayed)  Labs Reviewed - No data to  display ____________________________________________  EKG  None ____________________________________________  RADIOLOGY  Left ankle complete (viewed by me, interpreted per Dr. Cherly Hensenhang):  No evidence of acute fracture or dislocation. Chronic lateral  malleolar fragment demonstrates improved alignment.   ____________________________________________   PROCEDURES  Procedure(s) performed: None  Procedures  Critical Care performed: No  ____________________________________________   INITIAL IMPRESSION / ASSESSMENT AND PLAN / ED COURSE  Pertinent labs & imaging results that were available during my care of the patient were reviewed by me and considered in my medical decision making (see chart for details).  30 year old female who presents with left ankle sprain. Review of [place] narcotics database reveals that patient was dispensed #70 Suboxone films on 04/15/2016. This is to be a 28 day supply. Patient tells me a story that she is trying to get off Suboxone and back into pain management. I discussed with her that she would need to coordinate this through her PCP in I would not be able to dispense narcotic pain medications since she is on Suboxone. I did offer her a number of non-opioid analgesics which patient declined. Strict return precautions given. Patient verbalizes understanding and agrees with plan of care.  Clinical Course     ____________________________________________   FINAL CLINICAL IMPRESSION(S) / ED DIAGNOSES  Final diagnoses:  Left ankle sprain, initial encounter      NEW MEDICATIONS STARTED DURING THIS VISIT:  Discharge Medication List as of 04/27/2016  4:36 AM       Note:  This document was prepared using Dragon voice recognition software and may include unintentional dictation errors.    Irean HongJade J Sung, MD 04/27/16 0730

## 2016-04-27 NOTE — ED Notes (Signed)
Pt c/o left ankle pain after turning her ankle walking up stairs 2 days ago. Pt reports hx of ankle surgery in January top same ankle.

## 2016-04-27 NOTE — ED Triage Notes (Signed)
Patient reports fell on steps 3 days ago and having left ankle pain since.  Reports previous surgery to same ankle.

## 2016-04-27 NOTE — ED Notes (Signed)
MD Dolores FrameSung reviewed pt's d/c instructions, follow-up care, use of ice/elevation with pt. Pt verbalized understanding. Pt was told to wait for RN to apply splint, however pt left without signing papers or having splint applied.

## 2016-04-27 NOTE — Discharge Instructions (Signed)
1. Usually your crutches as needed to walk. You may bear weight as comfortable. 2. Elevate affected area and apply ice over splint. You may remove splint to bathe and sleep. 3. Return to the ER for worsening symptoms, persistent vomiting, difficulty breathing or other concerns.

## 2016-04-27 NOTE — ED Notes (Signed)
Pt reports she took 600 mg ibuprofen at approx 0100 today.

## 2016-06-29 ENCOUNTER — Ambulatory Visit: Payer: Medicaid Other

## 2016-06-29 ENCOUNTER — Ambulatory Visit
Admission: EM | Admit: 2016-06-29 | Discharge: 2016-06-29 | Disposition: A | Discharge status: Home / Self Care | Payer: Medicaid Other | Attending: Emergency Medicine | Admitting: Emergency Medicine

## 2016-06-29 ENCOUNTER — Encounter: Payer: Self-pay | Admitting: Emergency Medicine

## 2016-06-29 DIAGNOSIS — W19XXXA Unspecified fall, initial encounter: Secondary | ICD-10-CM

## 2016-06-29 DIAGNOSIS — S300XXA Contusion of lower back and pelvis, initial encounter: Secondary | ICD-10-CM

## 2016-06-29 DIAGNOSIS — Y92099 Unspecified place in other non-institutional residence as the place of occurrence of the external cause: Secondary | ICD-10-CM

## 2016-06-29 DIAGNOSIS — Y92009 Unspecified place in unspecified non-institutional (private) residence as the place of occurrence of the external cause: Secondary | ICD-10-CM

## 2016-06-29 DIAGNOSIS — M25552 Pain in left hip: Secondary | ICD-10-CM

## 2016-06-29 NOTE — ED Triage Notes (Signed)
Patient states that she fell down her stairs about 4 steps up and landed on her buttock.  Patient has a large bruise on her left buttock.  Patient denies pain any where else.

## 2016-06-29 NOTE — ED Provider Notes (Signed)
CSN: [id number]     Arrival date & time 06/29/16  1528 History   First MD Initiated Contact with Patient 06/29/16 1732     Chief Complaint  Patient presents with  . Fall   (Consider location/radiation/quality/duration/timing/severity/associated sxs/prior Treatment) HPI  This a 30 year old female is descending stairs sitting in her socks her ankle gave out slipped and fell onto her buttocks down 4 steps. She has noticed a large bruise on her left buttock which has been hurting worse since the accident. Is been taking ibuprofen and has had some left over Percocet which she tried but did helped only a slight bit. She has been able to ambulate and bear weight and is very uncomfortable. She does not have any right-sided pain.       Past Medical History:  Diagnosis Date  . Anxiety   . Closed left ankle fracture    original injury 2014--- reinjured 09/ 2016  . Complication of anesthesia    "fear not waking up"  . Left ankle pain   . Left ankle swelling    Past Surgical History:  Procedure Laterality Date  . LEG SURGERY Left   . TUBAL LIGATION  2007   History reviewed. No pertinent family history. Social History  Substance Use Topics  . Smoking status: Current Every Day Smoker    Years: 4.00    Types: Cigarettes    Last attempt to quit: 06/06/2014  . Smokeless tobacco: Never Used  . Alcohol use No   OB History    No data available     Review of Systems  Constitutional: Positive for activity change. Negative for chills, fatigue and fever.  Musculoskeletal: Positive for gait problem and myalgias.  Skin: Positive for color change.  All other systems reviewed and are negative.   Allergies  Penicillins; Hydrocodone; Toradol [ketorolac tromethamine]; and Tramadol  Home Medications   Prior to Admission medications   Medication Sig Start Date End Date Taking? Authorizing Provider  oxyCODONE (ROXICODONE) 5 MG immediate release tablet 1-2 Tabs po q6h prn severe pain 08/13/15    Governor Rooksebecca Lord, MD   Meds Ordered and Administered this Visit  Medications - No data to display  BP 98/76 (BP Location: Left Arm)   Pulse 71   Temp 97.2 F (36.2 C) (Tympanic)   Resp 16   Ht 5\' 1"  (1.549 m)   Wt 165 lb (74.8 kg)   LMP 06/01/2016 (Approximate)   SpO2 100%   BMI 31.18 kg/m  No data found.   Physical Exam  Constitutional: She appears well-developed and well-nourished. No distress.  HENT:  Head: Normocephalic and atraumatic.  Eyes: EOM are normal. Pupils are equal, round, and reactive to light. Right eye exhibits no discharge. Left eye exhibits no discharge.  Neck: Normal range of motion. Neck supple.  Musculoskeletal:  Examination was performed in the presence of Heather, RN as Nurse, children'schaperone and assistant. Patient has a large bruise over the left buttocks in the midportion. Is very tender to the touch. She is able to ambulate with a mild limp. She is able to bear full weight. Examination of the left hip shows a good Range of motion. No tenderness of the proximal femur and no trochanteric pain over the greater trochanter.  Skin: She is not diaphoretic.  Nursing note and vitals reviewed.   Urgent Care Course   Clinical Course     Procedures (including critical care time)  Labs Review Labs Reviewed - No data to display  Imaging Review Dg Hip  Unilat With Pelvis 2-3 Views Left  Result Date: 06/29/2016 CLINICAL DATA:  Patient with left hip pain status post fall. Initial encounter. EXAM: DG HIP (WITH OR WITHOUT PELVIS) 2-3V LEFT COMPARISON:  None. FINDINGS: There is no evidence of hip fracture or dislocation. There is no evidence of arthropathy or other focal bone abnormality. IMPRESSION: No acute osseous abnormality. Electronically Signed   By: Annia Beltrew  Davis M.D.   On: 06/29/2016 18:29     Visual Acuity Review  Right Eye Distance:   Left Eye Distance:   Bilateral Distance:    Right Eye Near:   Left Eye Near:    Bilateral Near:         MDM   1. Fall in  home, initial encounter   2. Hip pain, acute, left   3. Contusion of buttock, initial encounter    She has a deep contusion to her left buttock. Is no bony injury seen on x-ray. Patient is on Suboxone no narcotic medications will be prescribed. Told  Her that she can use heat on the area alternating with ice for comfort. She should call her physician prescribing the Suboxone to see if she has any recommendations for pain control. She'll follow-up with her primary care physician if she is not improving.    Lutricia FeilWilliam P Roemer, PA-C 06/29/16 1842

## 2016-11-26 ENCOUNTER — Ambulatory Visit: Payer: Medicaid Other | Attending: Anesthesiology | Admitting: Physical Therapy

## 2016-12-05 ENCOUNTER — Telehealth: Payer: Self-pay | Admitting: Obstetrics & Gynecology

## 2016-12-05 NOTE — Telephone Encounter (Signed)
Pt is schedule 12/26/16 with Tiburcio PeaHarris

## 2016-12-05 NOTE — Telephone Encounter (Signed)
Correction it was 2008

## 2016-12-05 NOTE — Telephone Encounter (Signed)
Pt former name Regina Mays, is calling today about her Essure with Harris in 2007 . Stating she is experiencing abdominal Pain. Pt would like to be seen. Please advise. Next opening is 12/26/16.

## 2016-12-05 NOTE — Telephone Encounter (Signed)
Appt 12/26/16 is apprpriate.

## 2016-12-26 ENCOUNTER — Encounter: Payer: Self-pay | Admitting: Obstetrics & Gynecology

## 2016-12-26 ENCOUNTER — Telehealth: Payer: Self-pay

## 2016-12-26 ENCOUNTER — Ambulatory Visit (INDEPENDENT_AMBULATORY_CARE_PROVIDER_SITE_OTHER): Payer: Medicaid Other | Admitting: Obstetrics & Gynecology

## 2016-12-26 VITALS — BP 120/70 | HR 83 | Ht 61.0 in | Wt 184.0 lb

## 2016-12-26 DIAGNOSIS — G43829 Menstrual migraine, not intractable, without status migrainosus: Secondary | ICD-10-CM | POA: Insufficient documentation

## 2016-12-26 DIAGNOSIS — N946 Dysmenorrhea, unspecified: Secondary | ICD-10-CM

## 2016-12-26 DIAGNOSIS — N920 Excessive and frequent menstruation with regular cycle: Secondary | ICD-10-CM | POA: Insufficient documentation

## 2016-12-26 MED ORDER — IBUPROFEN 800 MG PO TABS: 800.0000 mg | ORAL_TABLET | Freq: Three times a day (TID) | ORAL | 1 refills | Status: DC | PRN

## 2016-12-26 MED ORDER — MEDROXYPROGESTERONE ACETATE 10 MG PO TABS: 20.0000 mg | ORAL_TABLET | Freq: Every day | ORAL | 1 refills | Status: DC

## 2016-12-26 NOTE — Telephone Encounter (Signed)
Pt states she will take the ibuprofen, please send to pharmacy

## 2016-12-26 NOTE — Patient Instructions (Signed)
Total Laparoscopic Hysterectomy °A total laparoscopic hysterectomy is a minimally invasive surgery to remove your uterus and cervix. This surgery is performed by making several small cuts (incisions) in your abdomen. It can also be done with a thin, lighted tube (laparoscope) inserted into two small incisions in your lower abdomen. Your fallopian tubes and ovaries can be removed (bilateral salpingo-oophorectomy) during this surgery as well. Benefits of minimally invasive surgery include: °· Less pain. °· Less risk of blood loss. °· Less risk of infection. °· Quicker return to normal activities. ° °Tell a health care provider about: °· Any allergies you have. °· All medicines you are taking, including vitamins, herbs, eye drops, creams, and over-the-counter medicines. °· Any problems you or family members have had with anesthetic medicines. °· Any blood disorders you have. °· Any surgeries you have had. °· Any medical conditions you have. °What are the risks? °Generally, this is a safe procedure. However, as with any procedure, complications can occur. Possible complications include: °· Bleeding. °· Blood clots in the legs or lung. °· Infection. °· Injury to surrounding organs. °· Problems with anesthesia. °· Early menopause symptoms (hot flashes, night sweats, insomnia). °· Risk of conversion to an open abdominal incision. ° °What happens before the procedure? °· Ask your health care provider about changing or stopping your regular medicines. °· Do not take aspirin or blood thinners (anticoagulants) for 1 week before the surgery or as told by your health care provider. °· Do not eat or drink anything for 8 hours before the surgery or as told by your health care provider. °· Quit smoking if you smoke. °· Arrange for a ride home after surgery and for someone to help you at home during recovery. °What happens during the procedure? °· You will be given antibiotic medicine. °· An IV tube will be placed in your arm. You  will be given medicine to make you sleep (general anesthetic). °· A gas (carbon dioxide) will be used to inflate your abdomen. This will allow your surgeon to look inside your abdomen, perform your surgery, and treat any other problems found if necessary. °· Three or four small incisions (often less than 1/2 inch) will be made in your abdomen. One of these incisions will be made in the area of your belly button (navel). The laparoscope will be inserted into the incision. Your surgeon will look through the laparoscope while doing your procedure. °· Other surgical instruments will be inserted through the other incisions. °· Your uterus may be removed through your vagina or cut into small pieces and removed through the small incisions. °· Your incisions will be closed. °What happens after the procedure? °· The gas will be released from inside your abdomen. °· You will be taken to the recovery area where a nurse will watch and check your progress. Once you are awake, stable, and taking fluids well, without other problems, you will return to your room or be allowed to go home. °· There is usually minimal discomfort following the surgery because the incisions are so small. °· You will be given pain medicine while you are in the hospital and for when you go home. °This information is not intended to replace advice given to you by your health care provider. Make sure you discuss any questions you have with your health care provider. °Document Released: 05/18/2007 Document Revised: 12/27/2015 Document Reviewed: 02/08/2013 °Elsevier Interactive Patient Education © 2017 Else[employer] Inc. ° °

## 2016-12-26 NOTE — Progress Notes (Signed)
  HPI:      Regina Mays is a 31 y.o. [id number] who LMP was Patient's last menstrual period was 12/12/2016., presents today for a problem visit.  She complains of menorrhagia that  began several months ago and its severity is described as severe.  She has regular periods every 28 days and they are associated with severe menstrual cramping.  She has used the following for attempts at control: tampon and pad.  Severe migraines almost all moth but worse w periods.  Has been attributed to Essure by neuro, as well as to heavy periods.  Previous evaluation: none. Prior Diagnosis: menorrhagia. Previous Treatment: NSAID , she is on Suboxone currently,  with mild improvement Also has tried Provera for bleeding control.  She is has sex with males.  Contraception: tubal ligation. Hx of STDs: none. She is premenopausal.  PMHx: She  has a past medical history of Anxiety; Closed left ankle fracture; Complication of anesthesia; Left ankle pain; and Left ankle swelling. Also,  has a past surgical history that includes Tubal ligation (2007) and Leg Surgery (Left)., family history is not on file.,  reports that she has been smoking Cigarettes.  She has smoked for the past 4.00 years. She has never used smokeless tobacco. She reports that she does not drink alcohol or use drugs.  She has a current medication list which includes the following prescription(s): medroxyprogesterone and oxycodone. Also, is allergic to penicillins; hydrocodone; toradol [ketorolac tromethamine]; and tramadol.  Review of Systems  Constitutional: Negative for chills, fever and malaise/fatigue.  HENT: Negative for congestion, sinus pain and sore throat.   Eyes: Negative for blurred vision and pain.  Respiratory: Negative for cough and wheezing.   Cardiovascular: Negative for chest pain and leg swelling.  Gastrointestinal: Negative for abdominal pain, constipation, diarrhea, heartburn, nausea and vomiting.  Genitourinary: Negative  for dysuria, frequency, hematuria and urgency.  Musculoskeletal: Negative for back pain, joint pain, myalgias and neck pain.  Skin: Negative for itching and rash.  Neurological: Negative for dizziness, tremors and weakness.  Endo/Heme/Allergies: Does not bruise/bleed easily.  Psychiatric/Behavioral: Negative for depression. The patient is not nervous/anxious and does not have insomnia.     Objective: BP 120/70   Pulse 83   Ht 5\' 1"  (1.549 m)   Wt 184 lb (83.5 kg)   LMP 12/12/2016   BMI 34.77 kg/m  Physical Exam  Constitutional: She is oriented to person, place, and time. She appears well-developed and well-nourished. No distress.  Musculoskeletal: Normal range of motion.  Neurological: She is alert and oriented to person, place, and time.  Skin: Skin is warm and dry.  Psychiatric: She has a normal mood and affect.  Vitals reviewed.   ASSESSMENT/PLAN:  menorrhagia and dysmenorrhea and menstrual migraines  Problem List Items Addressed This Visit      Cardiovascular and Mediastinum   Menstrual migraine without status migrainosus, not intractable     Genitourinary   Dysmenorrhea - Primary   Relevant Orders   US Transvaginal Non-OB     Other   Menorrhagia with regular cycle   Relevant Orders   US Transvaginal Non-OB    Plans hysterectomy. Prior Essure a concern for causation of migraines, per her PCP, neurologists, and some literature support on this. Pros and cons of surgery discussed. F/u after US  Annamarie MajorPaul Skyelar Halliday, MD, Merlinda FrederickFACOG [facility], [facility] 12/26/2016  11:27 AM

## 2016-12-31 ENCOUNTER — Other Ambulatory Visit: Payer: Medicaid Other

## 2016-12-31 ENCOUNTER — Ambulatory Visit: Payer: Medicaid Other | Admitting: Obstetrics & Gynecology
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
## 2017-01-07 ENCOUNTER — Telehealth: Payer: Self-pay

## 2017-01-07 NOTE — Telephone Encounter (Signed)
Pt aware Im trying to get her in sooner for u/s. Please advise on stronger pain meds

## 2017-01-07 NOTE — Telephone Encounter (Signed)
Pt is schedule tomorrow 01/08/17 for ultrasound. Pt is still schedule for follow up on 01/21/17. Does pt need an follow up appt with Dr. Tiburcio PeaHarris? Pt is also asking for something more stronger then what she is currently taking for medicaton. Please advise

## 2017-01-07 NOTE — Telephone Encounter (Signed)
Do you want pt to come in for this pain?

## 2017-01-07 NOTE — Telephone Encounter (Signed)
Will f/u with her after US tomorrow.

## 2017-01-07 NOTE — Telephone Encounter (Signed)
Pt calling for something stronger for abdominal pain. She is missing work.  [phone]

## 2017-01-07 NOTE — Telephone Encounter (Signed)
Pt calling to say she is experiencing a lot of unbearable pain today.  She had to leave work b/c of it.  Please call.  [phone]

## 2017-01-07 NOTE — Telephone Encounter (Signed)
Plan US and then [facility] planning potentially. For now and until US, heat rest or reg meds she already takes - is all for her to do. No appt until US.  If she goes to ER she may get US sooner, but Im not recommending that.

## 2017-01-08 ENCOUNTER — Other Ambulatory Visit: Payer: Medicaid Other

## 2017-01-08 NOTE — Telephone Encounter (Signed)
No, but can call with US results later.  (again, she is not getting narcotic pain meds either way)

## 2017-01-08 NOTE — Telephone Encounter (Signed)
She cancelled her appt

## 2017-01-08 NOTE — Telephone Encounter (Signed)
Are you coming in the office today?

## 2017-01-21 ENCOUNTER — Other Ambulatory Visit: Payer: Medicaid Other

## 2017-01-21 ENCOUNTER — Ambulatory Visit: Payer: Medicaid Other | Admitting: Obstetrics & Gynecology

## 2017-01-26 ENCOUNTER — Ambulatory Visit: Payer: Medicaid Other | Admitting: Obstetrics & Gynecology

## 2017-01-26 ENCOUNTER — Other Ambulatory Visit: Payer: Medicaid Other

## 2017-01-30 ENCOUNTER — Telehealth: Payer: Self-pay

## 2017-01-30 NOTE — Telephone Encounter (Signed)
Pt had to resch u/s c PH d/t our machine was down.  Pt states she can't wait for u/s.  Needs something stronger for pain than tylenol as she can't deal with the pain.  Please call [phone].

## 2017-02-02 ENCOUNTER — Other Ambulatory Visit: Payer: Self-pay | Admitting: Obstetrics & Gynecology

## 2017-02-02 DIAGNOSIS — R102 Pelvic and perineal pain: Secondary | ICD-10-CM

## 2017-02-02 MED ORDER — MELOXICAM 15 MG PO TABS: 15.0000 mg | ORAL_TABLET | Freq: Every day | ORAL | 3 refills | Status: DC | PRN

## 2017-02-02 NOTE — Telephone Encounter (Signed)
Please schedule TRANSVAG[facility]AL UL[facility] this [facility] as waiting til July 18 is too long for patient's pain.  I can call her w results and then schedule intervention based on results.  No new Rx for pain med.

## 2017-02-02 NOTE — Telephone Encounter (Signed)
Left message to return call 

## 2017-02-02 NOTE — Telephone Encounter (Signed)
Pt needs you to call her, She doesn't understand why you will let her be in pain... Pt advised you have prescribed the meloxicam, she wants us to know that Suboxone does not take pain away, I advised pt you knew what Suboxone was for , pt aware you would call her when ever you had time,

## 2017-02-02 NOTE — Telephone Encounter (Signed)
Is she bleeding? If so can Rx med to help minimize this and pain associated with menses.  If not, (or also) will eRx Meloxicam which is safe to take daily with her Suboxone.  Cannot Rx narcotics.  Hopefully US and then even surgery soon.

## 2017-02-02 NOTE — Telephone Encounter (Signed)
Called pt and someone hung up the phone, will try again later

## 2017-02-03 ENCOUNTER — Other Ambulatory Visit: Payer: Self-pay | Admitting: Obstetrics & Gynecology

## 2017-02-03 ENCOUNTER — Telehealth: Payer: Self-pay

## 2017-02-03 DIAGNOSIS — R102 Pelvic and perineal pain: Secondary | ICD-10-CM

## 2017-02-03 NOTE — Telephone Encounter (Signed)
[facility] needs precertification on referral. CB# [phone]

## 2017-02-03 NOTE — Telephone Encounter (Signed)
Patient is scheduled for Thursday, [facility]05[facility]2017 [facility] 3[facility] with a full bladder. Lmtrc.

## 2017-02-05 ENCOUNTER — Ambulatory Visit: Payer: Medicaid Other

## 2017-02-18 ENCOUNTER — Other Ambulatory Visit: Payer: Medicaid Other

## 2017-02-18 ENCOUNTER — Ambulatory Visit: Payer: Medicaid Other | Admitting: Obstetrics & Gynecology

## 2017-03-13 ENCOUNTER — Other Ambulatory Visit: Payer: Self-pay | Admitting: Obstetrics & Gynecology

## 2017-03-13 DIAGNOSIS — N920 Excessive and frequent menstruation with regular cycle: Secondary | ICD-10-CM

## 2017-03-13 DIAGNOSIS — N946 Dysmenorrhea, unspecified: Secondary | ICD-10-CM

## 2017-03-18 ENCOUNTER — Ambulatory Visit: Payer: Medicaid Other | Admitting: Obstetrics & Gynecology

## 2017-03-18 ENCOUNTER — Ambulatory Visit (INDEPENDENT_AMBULATORY_CARE_PROVIDER_SITE_OTHER): Payer: Medicaid Other

## 2017-03-18 DIAGNOSIS — N920 Excessive and frequent menstruation with regular cycle: Secondary | ICD-10-CM | POA: Diagnosis not present

## 2017-03-18 DIAGNOSIS — N946 Dysmenorrhea, unspecified: Secondary | ICD-10-CM | POA: Diagnosis not present

## 2017-03-25 ENCOUNTER — Ambulatory Visit: Payer: Medicaid Other | Admitting: Obstetrics & Gynecology

## 2017-05-08 ENCOUNTER — Ambulatory Visit: Payer: Self-pay | Admitting: Obstetrics & Gynecology

## 2017-06-08 ENCOUNTER — Ambulatory Visit: Payer: Medicaid Other | Admitting: Obstetrics & Gynecology

## 2017-07-01 ENCOUNTER — Telehealth: Payer: Self-pay | Admitting: Obstetrics & Gynecology

## 2017-07-01 ENCOUNTER — Ambulatory Visit (INDEPENDENT_AMBULATORY_CARE_PROVIDER_SITE_OTHER): Payer: Medicaid Other | Admitting: Obstetrics & Gynecology

## 2017-07-01 ENCOUNTER — Encounter: Payer: Self-pay | Admitting: Obstetrics & Gynecology

## 2017-07-01 VITALS — BP 120/80 | HR 99 | Ht 61.0 in | Wt 176.0 lb

## 2017-07-01 DIAGNOSIS — N920 Excessive and frequent menstruation with regular cycle: Secondary | ICD-10-CM

## 2017-07-01 DIAGNOSIS — R102 Pelvic and perineal pain: Secondary | ICD-10-CM | POA: Diagnosis not present

## 2017-07-01 DIAGNOSIS — G8929 Other chronic pain: Secondary | ICD-10-CM

## 2017-07-01 DIAGNOSIS — F1120 Opioid dependence, uncomplicated: Secondary | ICD-10-CM | POA: Diagnosis not present

## 2017-07-01 MED ORDER — OXYCODONE-ACETAMINOPHEN 5-325 MG PO TABS: 1.0000 | ORAL_TABLET | Freq: Three times a day (TID) | ORAL | 0 refills | Status: DC | PRN

## 2017-07-01 NOTE — Telephone Encounter (Signed)
Lurena JoinerRebecca from [facility] is calling about patient's prescription Oxycondone. Please call back

## 2017-07-01 NOTE — Progress Notes (Signed)
Gynecology Pelvic Pain Evaluation   Chief Complaint  Patient presents with    abd pain back pain, wants esure removed and be referred to pain managment   History of Present Illness:   Patient is a 31 y.o. [id number] who LMP was No LMP recorded., presents today for a problem visit.  She complains of pain and heavy bleeding.   Her pain is localized to the deep pelvis area, described as intermittent, began several months ago and its severity is described as severe. The pain radiates to the  Non-radiating. She has these associated symptoms which include abdominal pain and headache. Patient has these modifiers which include relaxation and pain medication that make it better and unable to associate with any factor that make it worse.  Context includes: spontaneous and w periods.  She notes Positive menstrual cramping. She has regular periods every 28 days and they are associated with severe menstrual cramping.  She has used the following for attempts at control: tampon and pad.  Severe migraines almost all moth but worse w periods.  Has been attributed to Essure by neuro, as well as to heavy periods. Normal US in August  PMHx: She  has a past medical history of Anxiety, Closed left ankle fracture, Complication of anesthesia, Left ankle pain, and Left ankle swelling. Also,  has a past surgical history that includes Tubal ligation (2007) and Leg Surgery (Left)., family history includes Cancer in her mother; Diabetes in her maternal grandmother; Hypertension in her father and mother.,  reports that she has been smoking cigarettes.  She has smoked for the past 4.00 years. she has never used smokeless tobacco. She reports that she does not drink alcohol or use drugs.  She has a current medication list which includes the following prescription(s): ibuprofen, ibuprofen, meloxicam, and oxycodone-acetaminophen. Also, is allergic to penicillins; hydrocodone; toradol [ketorolac tromethamine]; and tramadol.  Review  of Systems  Constitutional: Negative for chills, fever and malaise/fatigue.  HENT: Negative for congestion, sinus pain and sore throat.   Eyes: Negative for blurred vision and pain.  Respiratory: Negative for cough and wheezing.   Cardiovascular: Negative for chest pain and leg swelling.  Gastrointestinal: Negative for abdominal pain, constipation, diarrhea, heartburn, nausea and vomiting.  Genitourinary: Negative for dysuria, frequency, hematuria and urgency.  Musculoskeletal: Negative for back pain, joint pain, myalgias and neck pain.  Skin: Negative for itching and rash.  Neurological: Negative for dizziness, tremors and weakness.  Endo/Heme/Allergies: Does not bruise/bleed easily.  Psychiatric/Behavioral: Negative for depression. The patient is not nervous/anxious and does not have insomnia.     Objective: BP 120/80   Pulse 99   Ht 5\' 1"  (1.549 m)   Wt 176 lb (79.8 kg)   BMI 33.25 kg/m  Physical Exam  Constitutional: She is oriented to person, place, and time. She appears well-developed and well-nourished. No distress.  Musculoskeletal: Normal range of motion.  Neurological: She is alert and oriented to person, place, and time.  Skin: Skin is warm and dry.  Psychiatric: She has a normal mood and affect.  Vitals reviewed.  Assessment: 31 y.o. [id number] with :.  1. Chronic female pelvic pain - Ambulatory referral to Pain Clinic  2. Menorrhagia with regular cycle - Options for surgery management, especially as it pertains to Essure as possible contributing factor to pain; hysterectomy as option to remove Essure safely in situ. Will help w menorrhagia as well. Surgery sch for 08/06/17.    3. Narcotic dependance Has been on Suboxone in past thru  pain clinic, not currently taking anything. Temproary Rx Percocet today (#21).   Referral pain clinic.  Problem List Items Addressed This Visit      Other   Menorrhagia with regular cycle    Other Visit Diagnoses    Chronic female  pelvic pain    -  Primary   Relevant Medications   ibuprofen (ADVIL,MOTRIN) 800 MG tablet   oxyCODONE-acetaminophen (PERCOCET) 5-325 MG tablet   Other Relevant Orders   Ambulatory referral to Pain Clinic    Moderate complexity with multiple unstable concerns.  A total of 30 minutes were spent face-to-face with the patient during this encounter and over half of that time dealt with counseling and coordination of care.  Annamarie MajorPaul Rokhaya Quinn, MD, Merlinda FrederickFACOG [facility], [facility] 07/01/2017  10:27 AM

## 2017-07-01 NOTE — Telephone Encounter (Signed)
-----   Message from Nadara Mustardobert P Harris, MD sent at 07/01/2017  9:31 AM EST ----- Regarding: referral and surgery REFERRAL to [place]  [address]  Surgery Booking Request Patient Full Name:   MRN: [id number]  DOB: [birth year]  Surgeon: Letitia Libraobert Paul Harris, MD  Requested Surgery Date and Time: 08/06/17 Primary Diagnosis AND Code: Chronic pelvic pain, Menorrhagia Secondary Diagnosis and Code:  Surgical Procedure: TLH/BS L&D Notification: No Admission Status: same day surgery Length of Surgery: 1 Special Case Needs: no H&P: yes (date) Phone Interview???: yes Interpreter: Language:  Medical Clearance: no Special Scheduling Instructions: no  Medicaid forms

## 2017-07-01 NOTE — Telephone Encounter (Signed)
[facility] is aware of [facility] at [facility] 07/29/17 @ 9:40am w/ Dr. Tiburcio PeaHarris, Pre-admit Testing phone interview to be scheduled, and OR on 08/06/17.

## 2017-07-06 ENCOUNTER — Telehealth: Payer: Self-pay

## 2017-07-06 NOTE — Telephone Encounter (Signed)
Pt states she has took 3 pills a day only has 3 pain pill left, wants to know if you can prescribe  her 1weeks more of pills to last until she can get to her pain management appointment

## 2017-07-06 NOTE — Telephone Encounter (Signed)
Left detailed message letting pt know  Rph  Will not be refilling the pain medication

## 2017-07-06 NOTE — Telephone Encounter (Signed)
Pt is calling needing to speak with Shanda BumpsJessica and and Harriett SineNancy. Pt is wanting to move her Surgery Date up because she is in a lot of pain. Please advise

## 2017-07-06 NOTE — Telephone Encounter (Signed)
No further Rx here.  That Rx was to last.  Sorry

## 2017-07-06 NOTE — Telephone Encounter (Signed)
Pt [facility] & would like a call back. No details given. ZO#[phone]#[phone]

## 2017-07-16 ENCOUNTER — Ambulatory Visit: Payer: Medicaid Other | Admitting: Obstetrics and Gynecology

## 2017-07-23 ENCOUNTER — Encounter: Payer: Self-pay | Admitting: Obstetrics & Gynecology

## 2017-07-23 ENCOUNTER — Ambulatory Visit (INDEPENDENT_AMBULATORY_CARE_PROVIDER_SITE_OTHER): Payer: Medicaid Other | Admitting: Obstetrics & Gynecology

## 2017-07-23 ENCOUNTER — Other Ambulatory Visit: Payer: Self-pay

## 2017-07-23 ENCOUNTER — Encounter
Admission: RE | Admit: 2017-07-23 | Discharge: 2017-07-23 | Disposition: A | Discharge status: Home / Self Care | Payer: Medicaid Other | Source: Ambulatory Visit | Attending: Obstetrics & Gynecology | Admitting: Obstetrics & Gynecology

## 2017-07-23 VITALS — BP 100/60 | Ht 61.0 in | Wt 173.0 lb

## 2017-07-23 DIAGNOSIS — N920 Excessive and frequent menstruation with regular cycle: Secondary | ICD-10-CM

## 2017-07-23 DIAGNOSIS — N946 Dysmenorrhea, unspecified: Secondary | ICD-10-CM | POA: Diagnosis not present

## 2017-07-23 DIAGNOSIS — T859XXS Unspecified complication of internal prosthetic device, implant and graft, sequela: Secondary | ICD-10-CM

## 2017-07-23 DIAGNOSIS — T859XXA Unspecified complication of internal prosthetic device, implant and graft, initial encounter: Secondary | ICD-10-CM | POA: Insufficient documentation

## 2017-07-23 DIAGNOSIS — G43829 Menstrual migraine, not intractable, without status migrainosus: Secondary | ICD-10-CM

## 2017-07-23 HISTORY — DX: Panic disorder (episodic paroxysmal anxiety): F41.0

## 2017-07-23 HISTORY — DX: Headache, unspecified: R51.9

## 2017-07-23 HISTORY — DX: Headache: R51

## 2017-07-23 MED ORDER — OXYCODONE-ACETAMINOPHEN 5-325 MG PO TABS: 1.0000 | ORAL_TABLET | ORAL | 0 refills | Status: DC | PRN

## 2017-07-23 NOTE — Progress Notes (Signed)
PRE-OPERATIVE HISTORY AND PHYSICAL EXAM  HPI:  Regina Mays is a 31 y.o. [id number] No LMP recorded.; she is being admitted for surgery related to abnormal uterine bleeding and pelvic pain.  Her pain is localized to the deep pelvis area, described as intermittent, began several months ago and its severity is described as severe. The pain radiates to the  Non-radiating. She has these associated symptoms which include abdominal pain and headache. Patient has these modifiers which include relaxation and pain medication that make it better and unable to associate with any factor that make it worse.  Context includes: spontaneous and w periods.  She notes Positive menstrual cramping. She has regular periods every28daysand they are associated with severemenstrual cramping. She has used the following for attempts at control: tampon and pad.Severe migraines almost all moth but worse w periods. 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 Normal US in August  PMHx: Past Medical History:  Diagnosis Date  . Anxiety   . Closed left ankle fracture    original injury 2014--- reinjured 09/ 2016  . Complication of anesthesia    "fear not waking up"  . Left ankle pain   . Left ankle swelling    Past Surgical History:  Procedure Laterality Date  . LEG SURGERY Left   . TUBAL LIGATION  2007   Family History  Problem Relation Age of Onset  . Hypertension Mother   . Cancer Mother   . Hypertension Father   . Diabetes Maternal Grandmother    Social History   Tobacco Use  . Smoking status: Current Every Day Smoker    Years: 4.00    Types: Cigarettes    Last attempt to quit: 06/06/2014    Years since quitting: 3.1  . Smokeless tobacco: Never Used  Substance Use Topics  . Alcohol use: No  . Drug use: No   No current outpatient medications on file. Allergies: Penicillins; Hydrocodone; Toradol [ketorolac tromethamine]; and Tramadol  Review of Systems    Constitutional: Negative for chills, fever and malaise/fatigue.  HENT: Negative for congestion, sinus pain and sore throat.   Eyes: Negative for blurred vision and pain.  Respiratory: Negative for cough and wheezing.   Cardiovascular: Negative for chest pain and leg swelling.  Gastrointestinal: Negative for abdominal pain, constipation, diarrhea, heartburn, nausea and vomiting.  Genitourinary: Negative for dysuria, frequency, hematuria and urgency.  Musculoskeletal: Negative for back pain, joint pain, myalgias and neck pain.  Skin: Negative for itching and rash.  Neurological: Negative for dizziness, tremors and weakness.  Endo/Heme/Allergies: Does not bruise/bleed easily.  Psychiatric/Behavioral: Negative for depression. The patient is not nervous/anxious and does not have insomnia.     Objective: BP 100/60   Ht 5\' 1"  (1.549 m)   Wt 173 lb (78.5 kg)   BMI 32.69 kg/m   Filed Weights   07/23/17 0946  Weight: 173 lb (78.5 kg)   Physical Exam  Constitutional: She is oriented to person, place, and time. She appears well-developed and well-nourished. No distress.  Genitourinary: Rectum normal, vagina normal and uterus normal. Pelvic exam was performed with patient supine. There is no rash or lesion on the right labia. There is no rash or lesion on the left labia. Vagina exhibits no lesion. No bleeding in the vagina. Right adnexum does not display mass and does not display tenderness. Left adnexum does not display mass and does not display tenderness. Cervix does not exhibit motion tenderness, lesion,  friability or polyp.   Uterus is mobile and midaxial. Uterus is not enlarged or exhibiting a mass.  HENT:  Head: Normocephalic and atraumatic. Head is without laceration.  Right Ear: Hearing normal.  Left Ear: Hearing normal.  Nose: No epistaxis.  No foreign bodies.  Mouth/Throat: Uvula is midline, oropharynx is clear and moist and mucous membranes are normal.  Eyes: Pupils are equal,  round, and reactive to light.  Neck: Normal range of motion. Neck supple. No thyromegaly present.  Cardiovascular: Normal rate and regular rhythm. Exam reveals no gallop and no friction rub.  No murmur heard. Pulmonary/Chest: Effort normal and breath sounds normal. No respiratory distress. She has no wheezes. Right breast exhibits no mass, no skin change and no tenderness. Left breast exhibits no mass, no skin change and no tenderness.  Abdominal: Soft. Bowel sounds are normal. She exhibits no distension. There is no tenderness. There is no rebound.  Musculoskeletal: Normal range of motion.  Neurological: She is alert and oriented to person, place, and time. No cranial nerve deficit.  Skin: Skin is warm and dry.  Psychiatric: She has a normal mood and affect. Judgment normal.  Vitals reviewed.  Assessment: 1. Menorrhagia with regular cycle   2. Dysmenorrhea   3. Menstrual migraine without status migrainosus, not intractable   4. Complications due to device, implant, and graft, sequela   All options discussed, plan for TL[facility] BS to remove Essure implants and source of bleeding and pain (uterus) Percocet Rx today for 21 pills as is on period w pain th\is week (Also plan on post op Rx next Thurs).  I have had a careful discussion with this patient about all the options available and the risk/benefits of each. I have fully informed this patient that surgery may subject her to a variety of discomforts and risks: She understands that most patients have surgery with little difficulty, but problems can happen ranging from minor to fatal. These include nausea, vomiting, pain, bleeding, infection, poor healing, hernia, or formation of adhesions. Unexpected reactions may occur from any drug or anesthetic given. Unintended injury may occur to other pelvic or abdominal structures such as Fallopian tubes, ovaries, bladder, ureter (tube from kidney to bladder), or bowel. Nerves going from the pelvis to the legs  may be injured. Any such injury may require immediate or later additional surgery to correct the problem. Excessive blood loss requiring transfusion is very unlikely but possible. Dangerous blood clots may form in the legs or lungs. Physical and sexual activity will be restricted in varying degrees for an indeterminate period of time but most often 2-6 weeks.  Finally, she understands that it is impossible to list every possible undesirable effect and that the condition for which surgery is done is not always cured or significantly improved, and in rare cases may be even worse.Ample time was given to answer all questions.  Annamarie MajorPaul Jlyn Bracamonte, MD, Merlinda FrederickFACOG [facility], [facility] 07/23/2017  10:05 AM

## 2017-07-23 NOTE — Addendum Note (Signed)
Addended by: Nadara MustardHARRIS, ROBERT P on: 07/23/2017 11:28 AM   Modules accepted: Orders, SmartSet

## 2017-07-23 NOTE — Patient Instructions (Signed)
  Your procedure is scheduled on: 07-30-17 THURSDAY Report to Same Day Surgery 2nd floor medical mall [facility](Medical Mall Entrance-take elevator on left to 2nd floor.  Check in with surgery information desk.) To find out your arrival time please call [phone] between 1PM - 3PM [facility]  Remember: Instructions that are not followed completely may result in serious medical risk, up to and including death, or upon the discretion of your surgeon and anesthesiologist your surgery may need to be rescheduled.    _x___ 1. Do not eat food after midnight the night before your procedure. NO GUM OR CANDY AFTER MIDNIGHT. You may drink clear liquids up to 2 hours before you are scheduled to arrive at the hospital for your procedure.  Do not drink clear liquids within 2 hours of your scheduled arrival to the hospital.  Clear liquids include  --Water or Apple juice without pulp  --Clear carbohydrate beverage such as ClearFast or Gatorade  --Black Coffee or Clear Tea (No milk, no creamers, do not add anything to  the coffee or Tea      __x__ 2. No Alcohol for 24 hours before or after surgery.   __x__3. No Smoking for 24 prior to surgery.   ____  4. Bring all medications with you on the day of surgery if instructed.    __x__ 5. Notify your doctor if there is any change in your medical condition     (cold, fever, infections).     Do not wear jewelry, make-up, hairpins, clips or nail polish.  Do not wear lotions, powders, or perfumes. You may wear deodorant.  Do not shave 48 hours prior to surgery. Men may shave face and neck.  Do not bring valuables to the hospital.    [facility] not responsible for any belongings or valuables.               Contacts, dentures or bridgework may not be worn into surgery.  Leave your suitcase in the car. After surgery it may be brought to your room.  For patients admitted to the hospital, discharge time is determined by your treatment team.   Patients  discharged the day of surgery will not be allowed to drive home.  You will need someone to drive you home and stay with you the night of your procedure.    Please read over the following fact sheets that you were given:   [facility] and or MRSA Information   ____ Take anti-hypertensive listed below, cardiac, seizure, asthma,anti-reflux and psychiatric medicines. These include:  1. NONE  2.  3.  4.  5.  6.  ____Fleets enema or Magnesium Citrate as directed.   _x___ Use CHG Soap or sage wipes as directed on instruction sheet   ____ Use inhalers on the day of surgery and bring to hospital day of surgery  ____ Stop Metformin and Janumet 2 days prior to surgery.    ____ Take 1/2 of usual insulin dose the night before surgery and none on the morning surgery.   ____ Follow recommendations from Cardiologist, Pulmonologist or PCP regarding stopping Aspirin, Coumadin, Plavix ,Eliquis, Effient, or Pradaxa, and Pletal.  X____Stop Anti-inflammatories such as Advil, Aleve, IBUPROFEN , Motrin, Naproxen, Naprosyn, Goodies powders or aspirin products NOW-OK to take Tylenol   ____ Stop supplements until after surgery.   ____ Bring C-Pap to the hospital.

## 2017-07-23 NOTE — Patient Instructions (Signed)

## 2017-07-29 ENCOUNTER — Other Ambulatory Visit: Payer: Self-pay

## 2017-07-29 ENCOUNTER — Encounter: Payer: Medicaid Other | Admitting: Obstetrics & Gynecology

## 2017-07-29 ENCOUNTER — Encounter
Admission: RE | Admit: 2017-07-29 | Discharge: 2017-07-29 | Disposition: A | Discharge status: Home / Self Care | Payer: Medicaid Other | Source: Ambulatory Visit | Attending: Obstetrics & Gynecology | Admitting: Obstetrics & Gynecology

## 2017-07-29 DIAGNOSIS — R102 Pelvic and perineal pain: Secondary | ICD-10-CM | POA: Insufficient documentation

## 2017-07-29 DIAGNOSIS — G8929 Other chronic pain: Secondary | ICD-10-CM | POA: Insufficient documentation

## 2017-07-29 DIAGNOSIS — N920 Excessive and frequent menstruation with regular cycle: Secondary | ICD-10-CM | POA: Diagnosis not present

## 2017-07-29 DIAGNOSIS — Z01812 Encounter for preprocedural laboratory examination: Secondary | ICD-10-CM | POA: Insufficient documentation

## 2017-07-29 LAB — CBC
HCT: 36.5 % (ref 35.0–47.0)
Hemoglobin: 12.1 g/dL (ref 12.0–16.0)
MCH: 28.6 pg (ref 26.0–34.0)
MCHC: 33.2 g/dL (ref 32.0–36.0)
MCV: 86.1 fL (ref 80.0–100.0)
Platelets: 269 10*3/uL (ref 150–440)
RBC: 4.24 MIL/uL (ref 3.80–5.20)
RDW: 14.2 % (ref 11.5–14.5)
WBC: 7.7 10*3/uL (ref 3.6–11.0)

## 2017-07-29 MED ORDER — CLINDAMYCIN PHOSPHATE 900 MG/50ML IV SOLN
900.0000 mg | Freq: Once | INTRAVENOUS | Status: AC
  Administered 2017-07-30: 900 mg via INTRAVENOUS

## 2017-07-30 ENCOUNTER — Ambulatory Visit: Payer: Medicaid Other | Admitting: Anesthesiology

## 2017-07-30 ENCOUNTER — Encounter
Admission: RE | Disposition: A | Discharge status: Home / Self Care | Payer: Self-pay | Source: Ambulatory Visit | Attending: Obstetrics & Gynecology

## 2017-07-30 ENCOUNTER — Ambulatory Visit
Admission: RE | Admit: 2017-07-30 | Discharge: 2017-07-30 | Disposition: A | Discharge status: Home / Self Care | Payer: Medicaid Other | Source: Ambulatory Visit | Attending: Obstetrics & Gynecology | Admitting: Obstetrics & Gynecology

## 2017-07-30 ENCOUNTER — Encounter: Payer: Self-pay | Admitting: *Deleted

## 2017-07-30 DIAGNOSIS — Z809 Family history of malignant neoplasm, unspecified: Secondary | ICD-10-CM | POA: Diagnosis not present

## 2017-07-30 DIAGNOSIS — Z8249 Family history of ischemic heart disease and other diseases of the circulatory system: Secondary | ICD-10-CM | POA: Insufficient documentation

## 2017-07-30 DIAGNOSIS — F419 Anxiety disorder, unspecified: Secondary | ICD-10-CM | POA: Diagnosis not present

## 2017-07-30 DIAGNOSIS — F17210 Nicotine dependence, cigarettes, uncomplicated: Secondary | ICD-10-CM | POA: Diagnosis not present

## 2017-07-30 DIAGNOSIS — Z880 Allergy status to penicillin: Secondary | ICD-10-CM | POA: Diagnosis not present

## 2017-07-30 DIAGNOSIS — Z79899 Other long term (current) drug therapy: Secondary | ICD-10-CM | POA: Diagnosis not present

## 2017-07-30 DIAGNOSIS — G8929 Other chronic pain: Secondary | ICD-10-CM | POA: Diagnosis not present

## 2017-07-30 DIAGNOSIS — R102 Pelvic and perineal pain: Secondary | ICD-10-CM | POA: Diagnosis not present

## 2017-07-30 DIAGNOSIS — Z888 Allergy status to other drugs, medicaments and biological substances status: Secondary | ICD-10-CM | POA: Insufficient documentation

## 2017-07-30 DIAGNOSIS — Z833 Family history of diabetes mellitus: Secondary | ICD-10-CM | POA: Insufficient documentation

## 2017-07-30 DIAGNOSIS — N920 Excessive and frequent menstruation with regular cycle: Secondary | ICD-10-CM | POA: Diagnosis present

## 2017-07-30 DIAGNOSIS — Z885 Allergy status to narcotic agent status: Secondary | ICD-10-CM | POA: Diagnosis not present

## 2017-07-30 DIAGNOSIS — R51 Headache: Secondary | ICD-10-CM | POA: Diagnosis not present

## 2017-07-30 DIAGNOSIS — N72 Inflammatory disease of cervix uteri: Secondary | ICD-10-CM

## 2017-07-30 DIAGNOSIS — T859XXA Unspecified complication of internal prosthetic device, implant and graft, initial encounter: Secondary | ICD-10-CM | POA: Diagnosis present

## 2017-07-30 DIAGNOSIS — F410 Panic disorder [episodic paroxysmal anxiety] without agoraphobia: Secondary | ICD-10-CM | POA: Diagnosis not present

## 2017-07-30 DIAGNOSIS — G43829 Menstrual migraine, not intractable, without status migrainosus: Secondary | ICD-10-CM | POA: Diagnosis present

## 2017-07-30 DIAGNOSIS — N946 Dysmenorrhea, unspecified: Secondary | ICD-10-CM | POA: Diagnosis present

## 2017-07-30 HISTORY — PX: LAPAROSCOPIC HYSTERECTOMY: SHX1926

## 2017-07-30 HISTORY — PX: CYSTOSCOPY: SHX5120

## 2017-07-30 LAB — POCT PREGNANCY, URINE: PREG TEST UR: NEGATIVE

## 2017-07-30 LAB — ABO/RH: ABO/RH(D): A NEG

## 2017-07-30 LAB — TYPE AND SCREEN
ABO/RH(D): A NEG
Antibody Screen: NEGATIVE

## 2017-07-30 SURGERY — HYSTERECTOMY, TOTAL, LAPAROSCOPIC
Anesthesia: General

## 2017-07-30 MED ORDER — ACETAMINOPHEN 10 MG/ML IV SOLN
INTRAVENOUS | Status: DC | PRN
  Administered 2017-07-30: 1000 mg via INTRAVENOUS

## 2017-07-30 MED ORDER — ONDANSETRON HCL 4 MG/2ML IJ SOLN
INTRAMUSCULAR | Status: DC | PRN
  Administered 2017-07-30: 4 mg via INTRAVENOUS

## 2017-07-30 MED ORDER — BUPIVACAINE HCL (PF) 0.5 % IJ SOLN
INTRAMUSCULAR | Status: DC | PRN
  Administered 2017-07-30: 9 mL

## 2017-07-30 MED ORDER — CLINDAMYCIN PHOSPHATE 900 MG/50ML IV SOLN
INTRAVENOUS | Status: AC
  Filled 2017-07-30: qty 50

## 2017-07-30 MED ORDER — PROPOFOL 10 MG/ML IV BOLUS
INTRAVENOUS | Status: DC | PRN
  Administered 2017-07-30: 160 mg via INTRAVENOUS

## 2017-07-30 MED ORDER — LACTATED RINGERS IV SOLN
INTRAVENOUS | Status: DC
  Administered 2017-07-30: 10:00:00 via INTRAVENOUS

## 2017-07-30 MED ORDER — SUGAMMADEX SODIUM 200 MG/2ML IV SOLN
INTRAVENOUS | Status: DC | PRN
  Administered 2017-07-30: 157 mg via INTRAVENOUS

## 2017-07-30 MED ORDER — MIDAZOLAM HCL 2 MG/2ML IJ SOLN
INTRAMUSCULAR | Status: DC | PRN
  Administered 2017-07-30: 2 mg via INTRAVENOUS

## 2017-07-30 MED ORDER — FAMOTIDINE 20 MG PO TABS
ORAL_TABLET | ORAL | Status: AC
  Administered 2017-07-30: 20 mg via ORAL
  Filled 2017-07-30: qty 1

## 2017-07-30 MED ORDER — DEXMEDETOMIDINE HCL 200 MCG/2ML IV SOLN
INTRAVENOUS | Status: DC | PRN
  Administered 2017-07-30: 12 ug via INTRAVENOUS

## 2017-07-30 MED ORDER — OXYCODONE HCL 5 MG/5ML PO SOLN: 5.0000 mg | Freq: Once | ORAL | Status: AC | PRN

## 2017-07-30 MED ORDER — OXYCODONE HCL 5 MG PO TABS
ORAL_TABLET | ORAL | Status: AC
  Filled 2017-07-30: qty 1

## 2017-07-30 MED ORDER — OXYCODONE-ACETAMINOPHEN 5-325 MG PO TABS: 1.0000 | ORAL_TABLET | ORAL | 0 refills | Status: DC | PRN

## 2017-07-30 MED ORDER — FENTANYL CITRATE (PF) 100 MCG/2ML IJ SOLN
25.0000 ug | INTRAMUSCULAR | Status: DC | PRN
  Administered 2017-07-30 (×3): 50 ug via INTRAVENOUS

## 2017-07-30 MED ORDER — FENTANYL CITRATE (PF) 100 MCG/2ML IJ SOLN
INTRAMUSCULAR | Status: DC | PRN
  Administered 2017-07-30: 150 ug via INTRAVENOUS
  Administered 2017-07-30: 100 ug via INTRAVENOUS

## 2017-07-30 MED ORDER — OXYCODONE HCL 5 MG PO TABS
5.0000 mg | ORAL_TABLET | Freq: Once | ORAL | Status: AC | PRN
  Administered 2017-07-30: 5 mg via ORAL

## 2017-07-30 MED ORDER — ROCURONIUM BROMIDE 100 MG/10ML IV SOLN
INTRAVENOUS | Status: DC | PRN
  Administered 2017-07-30: 40 mg via INTRAVENOUS

## 2017-07-30 MED ORDER — LIDOCAINE 2% (20 MG/ML) 5 ML SYRINGE
INTRAMUSCULAR | Status: DC | PRN
  Administered 2017-07-30: 100 mg via INTRAVENOUS

## 2017-07-30 MED ORDER — FENTANYL CITRATE (PF) 100 MCG/2ML IJ SOLN
INTRAMUSCULAR | Status: DC
Start: 2017-07-30 — End: 2017-07-30
  Filled 2017-07-30: qty 2

## 2017-07-30 MED ORDER — PHENYLEPHRINE HCL 10 MG/ML IJ SOLN
INTRAMUSCULAR | Status: DC | PRN
  Administered 2017-07-30: 100 ug via INTRAVENOUS
  Administered 2017-07-30: 80 ug via INTRAVENOUS

## 2017-07-30 MED ORDER — DEXAMETHASONE SODIUM PHOSPHATE 10 MG/ML IJ SOLN
INTRAMUSCULAR | Status: DC | PRN
  Administered 2017-07-30: 10 mg via INTRAVENOUS

## 2017-07-30 MED ORDER — LACTATED RINGERS IV SOLN: INTRAVENOUS | Status: DC

## 2017-07-30 MED ORDER — FAMOTIDINE 20 MG PO TABS
20.0000 mg | ORAL_TABLET | Freq: Once | ORAL | Status: AC
  Administered 2017-07-30: 20 mg via ORAL

## 2017-07-30 MED ORDER — FENTANYL CITRATE (PF) 100 MCG/2ML IJ SOLN
INTRAMUSCULAR | Status: AC
  Filled 2017-07-30: qty 2

## 2017-07-30 SURGICAL SUPPLY — 49 items
BAG URINE DRAINAGE (UROLOGICAL SUPPLIES) ×4 IMPLANT
BLADE SURG SZ11 CARB STEEL (BLADE) ×4 IMPLANT
CANISTER SUCT 1200ML W/VALVE (MISCELLANEOUS) ×4 IMPLANT
CATH FOLEY 2WAY  5CC 16FR (CATHETERS) ×2
CATH URTH 16FR FL 2W BLN LF (CATHETERS) ×2 IMPLANT
CHLORAPREP W/TINT 26ML (MISCELLANEOUS) ×4 IMPLANT
DEFOGGER SCOPE WARMER CLEARIFY (MISCELLANEOUS) ×4 IMPLANT
DERMABOND ADVANCED (GAUZE/BANDAGES/DRESSINGS) ×2
DERMABOND ADVANCED .7 DNX12 (GAUZE/BANDAGES/DRESSINGS) ×2 IMPLANT
DEVICE SUTURE ENDOST 10MM (ENDOMECHANICALS) ×4 IMPLANT
DRAPE CAMERA CLOSED 9X96 (DRAPES) ×4 IMPLANT
DRSG TEGADERM 2-3/8X2-3/4 SM (GAUZE/BANDAGES/DRESSINGS) IMPLANT
GAUZE SPONGE NON-WVN 2X2 STRL (MISCELLANEOUS) IMPLANT
GLOVE BIO SURGEON STRL SZ8 (GLOVE) ×20 IMPLANT
GLOVE INDICATOR 8.0 STRL GRN (GLOVE) ×4 IMPLANT
GOWN STRL REUS W/ TWL LRG LVL3 (GOWN DISPOSABLE) ×2 IMPLANT
GOWN STRL REUS W/ TWL XL LVL3 (GOWN DISPOSABLE) ×4 IMPLANT
GOWN STRL REUS W/TWL LRG LVL3 (GOWN DISPOSABLE) ×2
GOWN STRL REUS W/TWL XL LVL3 (GOWN DISPOSABLE) ×4
GRASPER SUT TROCAR 14GX15 (MISCELLANEOUS) ×4 IMPLANT
IRRIGATION STRYKERFLOW (MISCELLANEOUS) ×2 IMPLANT
IRRIGATOR STRYKERFLOW (MISCELLANEOUS) ×4
IV LACTATED RINGERS 1000ML (IV SOLUTION) ×4 IMPLANT
KIT PINK PAD W/HEAD ARE REST (MISCELLANEOUS) ×4
KIT PINK PAD W/HEAD ARM REST (MISCELLANEOUS) ×2 IMPLANT
KIT RM TURNOVER CYSTO AR (KITS) ×4 IMPLANT
LABEL OR SOLS (LABEL) ×4 IMPLANT
MANIPULATOR VCARE LG CRV RETR (MISCELLANEOUS) IMPLANT
MANIPULATOR VCARE SML CRV RETR (MISCELLANEOUS) IMPLANT
MANIPULATOR VCARE STD CRV RETR (MISCELLANEOUS) ×4 IMPLANT
NEEDLE VERESS 14GA 120MM (NEEDLE) ×4 IMPLANT
NS IRRIG 500ML POUR BTL (IV SOLUTION) ×4 IMPLANT
OCCLUDER COLPOPNEUMO (BALLOONS) ×4 IMPLANT
PACK GYN LAPAROSCOPIC (MISCELLANEOUS) ×4 IMPLANT
PAD OB MATERNITY 4.3X12.25 (PERSONAL CARE ITEMS) ×4 IMPLANT
PAD PREP 24X41 OB/GYN DISP (PERSONAL CARE ITEMS) ×4 IMPLANT
SCISSORS METZENBAUM CVD 33 (INSTRUMENTS) IMPLANT
SET CYSTO W/LG BORE CLAMP LF (SET/KITS/TRAYS/PACK) ×4 IMPLANT
SHEARS HARMONIC ACE PLUS 36CM (ENDOMECHANICALS) ×4 IMPLANT
SLEEVE ENDOPATH XCEL 5M (ENDOMECHANICALS) ×4 IMPLANT
SPONGE VERSALON 2X2 STRL (MISCELLANEOUS)
SUT ENDO VLOC 180-0-8IN (SUTURE) ×4 IMPLANT
SUT VIC AB 0 CT1 36 (SUTURE) ×4 IMPLANT
SUT VIC AB 4-0 FS2 27 (SUTURE) ×4 IMPLANT
SYR 10ML LL (SYRINGE) ×4 IMPLANT
SYR 50ML LL SCALE MARK (SYRINGE) ×4 IMPLANT
TROCAR ENDO BLADELESS 11MM (ENDOMECHANICALS) ×4 IMPLANT
TROCAR XCEL NON-BLD 5MMX100MML (ENDOMECHANICALS) ×4 IMPLANT
TUBING INSUF HEATED (TUBING) ×4 IMPLANT

## 2017-07-30 NOTE — Op Note (Signed)
Operative Report:  PRE-OP DIAGNOSIS: CHRONIC PELVIC PAIN,MENORRHAGIA   POST-OP DIAGNOSIS: CHRONIC PELVIC PAIN,MENORRHAGIA   PROCEDURE: Procedure(s): HYSTERECTOMY TOTAL LAPAROSCOPIC BILATERAL SALPINGECTOMY CYSTOSCOPY  SURGEON: Annamarie MajorPaul Harris, MD, FACOG  ASSISTANT: Dr Edison PaceS Jackson   ANESTHESIA: General endotracheal anesthesia  ESTIMATED BLOOD LOSS: less than 50   SPECIMENS: Uterus, Tubes.  COMPLICATIONS: None  DISPOSITION: stable to PACU  FINDINGS: Intraabdominal adhesions were not noted. Normal ovaries.  Essure noted in tubes on dissection after removal.  PROCEDURE:  The patient was taken to the OR where anesthesia was administed. She was prepped and draped in the normal sterile fashion in the dorsal lithotomy position in the Stanford[place] stirrups. A time out was performed. A Graves speculum was inserted, the cervix was grasped with a single tooth tenaculum and the endometrial cavity was sounded. The cervix was progressively dilated to a size 18 [place] with [employer] Corporation[employer]att dilators. A V-Care uterine manipulator was inserted in the usual fashion without incident. Gloves were changed and attention was turned to the abdomen.   An infraumbilical transverse 5mm skin incision was made with the scalpel after local anesthesia applied to the skin. A Veress-step needle was inserted in the usual fashion and confirmed using the hanging drop technique. A pneumoperitoneum was obtained by insufflation of CO2 (opening pressure of 4mmHg) to 15mmHg. A diagnostic laparoscopy was performed yielding the previously described findings. Attention was turned to the left lower quadrant where after visualization of the inferior epigastric vessels a 5mm skin incision was made with the scalpel. A 5 mm laparoscopic port was inserted. The same procedure was repeated in the right lower quadrant with a 11mm trocar. Attention was turned to the left aspect of the uterus, where after visualization of the ureter, the round ligament was coagulated  and transected using the 5mm Harmonic Scapel. The anterior and posterior leafs of the broad ligament were dissected off as the anterior one was coagulated and transected in a caudal direction towards the cuff of the uterine manipulator.  Attention was then turned to the left fallopian tube which was recognized by visualization of the fimbria. The tube is excised to its attachment to the uterus. The uterine-ovarian ligament and its blood vessels were carefully coagulated and transected using the Harmonic scapel.  Attention was turned to the right aspect of the uterus where the same procedure was performed.  The vesicouterine reflection of the peritoneum was dissected with the harmonic scapel and the bladder flap was created bluntly.  The uterine vessels were coagulated and transected bilaterally using first bipolar cautery and then the harmonic scapel. A 360 degree, circumferential colpotomy was done to completely amputate the uterus with cervix and tubes. Once the specimen was amputated it was delivered through the vagina.   The colpotomy was repaired in a simple interrupted fashion using a 0-Polysorb suture with an endo-stitch device.  Vaginal exam confirms complete closure.  The cavity was copiously irrigated. A survey of the pelvic cavity revealed adequate hemostasis and no injury to bowel, bladder, or ureter.   A diagnostic cystoscopy was performed using saline distension of bladder with no lesions or injuries noted.  Bilateral urine flow from each ureteral orifice is visualized.  At this point the procedure was finalized. All the instruments were removed from the patient's body. Gas was expelled and patient is leveled.  Incisions are closed with skin adhesive.    Patient goes to recovery room in stable condition.  All sponge, instrument, and needle counts are correct x2.     Annamarie MajorPaul Harris, MD,  Merlinda FrederickFACOG [facility], [facility] 07/30/2017  11:02 AM

## 2017-07-30 NOTE — Anesthesia Post-op Follow-up Note (Signed)
Anesthesia QCDR form completed.        

## 2017-07-30 NOTE — Progress Notes (Signed)
Pt states wants to go home

## 2017-07-30 NOTE — Anesthesia Preprocedure Evaluation (Signed)
Anesthesia Evaluation  Patient identified by MRN, date of birth, ID band Patient awake    Reviewed: Allergy & Precautions, H&P , NPO status , Patient's Chart, lab work & pertinent test results  History of Anesthesia Complications (+) Emergence Delirium and history of anesthetic complications  Airway Mallampati: II  TM Distance: >3 FB Neck ROM: full    Dental  (+) Chipped, Poor Dentition   Pulmonary neg shortness of breath, former smoker,           Cardiovascular Exercise Tolerance: Good (-) angina(-) Past MI and (-) DOE negative cardio ROS       Neuro/Psych  Headaches, PSYCHIATRIC DISORDERS Anxiety    GI/Hepatic negative GI ROS, Neg liver ROS, neg GERD  ,  Endo/Other  negative endocrine ROS  Renal/GU      Musculoskeletal   Abdominal   Peds  Hematology negative hematology ROS (+)   Anesthesia Other Findings Past Medical History: No date: Anxiety     Comment:  NO MEDS No date: Closed left ankle fracture     Comment:  original injury 2014--- reinjured 09/ 2016 No date: Complication of anesthesia     Comment:  "fear not waking up"-PT STATES SHE WAS VERY AGITATED               WHEN SHE WOKE UP AFTER WISDONM TEETH No date: Headache     Comment:  MIGRAINES No date: Left ankle pain No date: Left ankle swelling No date: Panic attacks  Past Surgical History: 2016: LEG SURGERY; Left     Comment:  ankle was broken. shifted bones to grow back together 2007: TUBAL LIGATION No date: WISDOM TOOTH EXTRACTION  BMI    Body Mass Index:  32.69 kg/m      Reproductive/Obstetrics negative OB ROS                             Anesthesia Physical Anesthesia Plan  ASA: III  Anesthesia Plan: General ETT   Post-op Pain Management:    Induction: Intravenous  PONV Risk Score and Plan: 4 or greater and Ondansetron, Dexamethasone and Midazolam  Airway Management Planned: Oral ETT  Additional  Equipment:   Intra-op Plan:   Post-operative Plan: Extubation in OR  Informed Consent: I have reviewed the patients History and Physical, chart, labs and discussed the procedure including the risks, benefits and alternatives for the proposed anesthesia with the patient or authorized representative who has indicated his/her understanding and acceptance.   Dental Advisory Given  Plan Discussed with: Anesthesiologist, CRNA and Surgeon  Anesthesia Plan Comments: (Patient consented for risks of anesthesia including but not limited to:  - adverse reactions to medications - damage to teeth, lips or other oral mucosa - sore throat or hoarseness - Damage to heart, brain, lungs or loss of life  Patient voiced understanding.)        Anesthesia Quick Evaluation

## 2017-07-30 NOTE — Anesthesia Postprocedure Evaluation (Signed)
Anesthesia Post Note  Patient: Regina Mays  Procedure(s) Performed: HYSTERECTOMY TOTAL LAPAROSCOPIC BILATERAL SALPINGECTOMY (Bilateral ) CYSTOSCOPY (N/A )  Patient location during evaluation: PACU Anesthesia Type: General Level of consciousness: awake and alert Pain management: pain level controlled Vital Signs Assessment: post-procedure vital signs reviewed and stable Respiratory status: spontaneous breathing, nonlabored ventilation, respiratory function stable and patient connected to nasal cannula oxygen Cardiovascular status: blood pressure returned to baseline and stable Postop Assessment: no apparent nausea or vomiting Anesthetic complications: no     Last Vitals:  Vitals:   07/30/17 1232 07/30/17 1317  BP: 102/60 104/60  Pulse: (!) 56 62  Resp: 16   Temp: 36.5 C   SpO2: 98% 100%    Last Pain:  Vitals:   07/30/17 1317  TempSrc:   PainSc: 3                  Cleda MccreedyJoseph K Piscitello

## 2017-07-30 NOTE — Anesthesia Procedure Notes (Signed)
Procedure Name: Intubation Date/Time: 07/30/2017 10:13 AM Performed by: Paulette BlanchParas, Venus Ruhe, CRNA Pre-anesthesia Checklist: Patient identified, Patient being monitored, Timeout performed, Emergency Drugs available and Suction available Patient Re-evaluated:Patient Re-evaluated prior to induction Oxygen Delivery Method: Circle system utilized Preoxygenation: Pre-oxygenation with 100% oxygen Induction Type: IV induction Ventilation: Mask ventilation without difficulty Laryngoscope Size: 3 and Miller Grade View: Grade I Tube type: Oral Tube size: 7.0 mm Number of attempts: 1 Placement Confirmation: ETT inserted through vocal cords under direct vision,  positive ETCO2 and breath sounds checked- equal and bilateral Secured at: 21 cm Tube secured with: Tape Dental Injury: Teeth and Oropharynx as per pre-operative assessment

## 2017-07-30 NOTE — Transfer of Care (Addendum)
Immediate Anesthesia Transfer of Care Note  Patient: Regina Mays  Procedure(s) Performed: HYSTERECTOMY TOTAL LAPAROSCOPIC BILATERAL SALPINGECTOMY (Bilateral )  Patient Location: Anesthesia Type General  Level of Consciousness: awake, alert  and oriented  Airway & Oxygen Therapy: Patient Spontanous Breathing and Patient connected to nasal cannula oxygen  Post-op Assessment: Report given to RN and Post -op Vital signs reviewed and stable  Post vital signs: Reviewed and stable  Last Vitals:  Vitals:   07/30/17 0844  BP: (!) 111/93  Pulse: 81  Resp: 15  Temp: (!) 36.2 C  SpO2: 100%    Last Pain:  Vitals:   07/30/17 0844  TempSrc: Tympanic  PainSc: 6       Patients Stated Pain Goal: 1 (07/30/17 0844)  Complications: No apparent anesthesia complications

## 2017-07-30 NOTE — H&P (Signed)
History and Physical Interval Note:  07/30/2017 8:39 AM  Regina Mays  has presented today for surgery, with the diagnosis of CHRONIC PELVIC PAIN,MENORRHAGIA  The various methods of treatment have been discussed with the patient and family. After consideration of risks, benefits and other options for treatment, the patient has consented to  Procedure(s): HYSTERECTOMY TOTAL LAPAROSCOPIC with BILATERAL SALPINGECTOMY and CYSTOSCOPY  as a surgical intervention .  The patient's history has been reviewed, patient examined, no change in status, stable for surgery.  Pt has the following beta blocker history-  Not taking Beta Blocker.  I have reviewed the patient's chart and labs.  Questions were answered to the patient's satisfaction.    Letitia Libraobert Paul Crosby Bevan

## 2017-07-30 NOTE — Discharge Instructions (Signed)

## 2017-07-31 ENCOUNTER — Encounter: Payer: Self-pay | Admitting: Obstetrics & Gynecology

## 2017-07-31 ENCOUNTER — Telehealth: Payer: Self-pay | Admitting: Obstetrics & Gynecology

## 2017-07-31 LAB — SURGICAL PATHOLOGY

## 2017-07-31 NOTE — Telephone Encounter (Signed)
Please advise 

## 2017-07-31 NOTE — Telephone Encounter (Signed)
Pt is calling needing a different prescription due to the pain control prescribe not helping with pain control. Per Dr. Jean RosenthalJackson patient would need to follow up with Dr. Tiburcio PeaHarris for a different pain control due to patient medication history and he doesn't feel comfortable prescribing medication. Please advise.

## 2017-08-01 ENCOUNTER — Encounter: Payer: Self-pay | Admitting: Obstetrics & Gynecology

## 2017-08-01 ENCOUNTER — Emergency Department
Admission: EM | Admit: 2017-08-01 | Discharge: 2017-08-01 | Disposition: A | Discharge status: Home / Self Care | Payer: Medicaid Other | Attending: Emergency Medicine | Admitting: Emergency Medicine

## 2017-08-01 ENCOUNTER — Other Ambulatory Visit: Payer: Self-pay | Admitting: Obstetrics & Gynecology

## 2017-08-01 DIAGNOSIS — R102 Pelvic and perineal pain: Secondary | ICD-10-CM | POA: Insufficient documentation

## 2017-08-01 DIAGNOSIS — Z5321 Procedure and treatment not carried out due to patient leaving prior to being seen by health care provider: Secondary | ICD-10-CM | POA: Insufficient documentation

## 2017-08-01 LAB — COMPREHENSIVE METABOLIC PANEL
ALBUMIN: 4 g/dL (ref 3.5–5.0)
ALT: 16 U/L (ref 14–54)
AST: 14 U/L — ABNORMAL LOW (ref 15–41)
Alkaline Phosphatase: 40 U/L (ref 38–126)
Anion gap: 3 — ABNORMAL LOW (ref 5–15)
BUN: 20 mg/dL (ref 6–20)
CHLORIDE: 106 mmol/L (ref 101–111)
CO2: 29 mmol/L (ref 22–32)
CREATININE: 0.77 mg/dL (ref 0.44–1.00)
Calcium: 8.9 mg/dL (ref 8.9–10.3)
GFR calc non Af Amer: >60 mL/min (ref >60)
Glucose, Bld: 117 mg/dL — ABNORMAL HIGH (ref 65–99)
Potassium: 3.6 mmol/L (ref 3.5–5.1)
SODIUM: 138 mmol/L (ref 135–145)
Total Bilirubin: 0.5 mg/dL (ref 0.3–1.2)
Total Protein: 7.1 g/dL (ref 6.5–8.1)

## 2017-08-01 LAB — CBC WITH DIFFERENTIAL/PLATELET
BASOS ABS: 0.1 10*3/uL (ref 0–0.1)
BASOS PCT: 1 %
EOS ABS: 0.1 10*3/uL (ref 0–0.7)
EOS PCT: 1 %
HCT: 34.6 % — ABNORMAL LOW (ref 35.0–47.0)
Hemoglobin: 11.7 g/dL — ABNORMAL LOW (ref 12.0–16.0)
LYMPHS ABS: 2.8 10*3/uL (ref 1.0–3.6)
Lymphocytes Relative: 31 %
MCH: 28.9 pg (ref 26.0–34.0)
MCHC: 33.8 g/dL (ref 32.0–36.0)
MCV: 85.7 fL (ref 80.0–100.0)
Monocytes Absolute: 0.6 10*3/uL (ref 0.2–0.9)
Monocytes Relative: 7 %
Neutro Abs: 5.3 10*3/uL (ref 1.4–6.5)
Neutrophils Relative %: 60 %
PLATELETS: 267 10*3/uL (ref 150–440)
RBC: 4.04 MIL/uL (ref 3.80–5.20)
RDW: 14.3 % (ref 11.5–14.5)
WBC: 8.9 10*3/uL (ref 3.6–11.0)

## 2017-08-01 MED ORDER — OXYCODONE-ACETAMINOPHEN 10-325 MG PO TABS: 1.0000 | ORAL_TABLET | ORAL | 0 refills | Status: DC | PRN

## 2017-08-01 NOTE — ED Notes (Signed)
Pt states she is going to leave, wait is too long. Pt states she will go to [facility] in am.

## 2017-08-01 NOTE — ED Triage Notes (Signed)
Pt states post vaginal hysterectomy yesterday. Pt complains of vaginal pain and pelvic pain that is not controlled by narcotics at home. Pt states last dose of pain medication was at 2300.

## 2017-08-01 NOTE — ED Notes (Signed)
This RN inspected incisions on left and right lower abdomen. incisions clean, no swelling, redness, or drainage noted to areas.

## 2017-08-01 NOTE — ED Notes (Signed)
Pt states is here post hysterectomy for pelvic pain. Pt states last dose of pain medication was at 2300.

## 2017-08-01 NOTE — ED Notes (Signed)
Patient reports last dose of prescribed pain medication take between 2300-2300 yesterday evening.

## 2017-08-03 ENCOUNTER — Encounter: Payer: Self-pay | Admitting: Obstetrics & Gynecology

## 2017-08-03 ENCOUNTER — Other Ambulatory Visit: Payer: Self-pay | Admitting: Obstetrics & Gynecology

## 2017-08-03 MED ORDER — OXYCODONE-ACETAMINOPHEN 10-325 MG PO TABS: 1.0000 | ORAL_TABLET | Freq: Four times a day (QID) | ORAL | 0 refills | Status: DC | PRN

## 2017-08-03 NOTE — Telephone Encounter (Signed)
Please advise 

## 2017-08-07 ENCOUNTER — Encounter: Payer: Self-pay | Admitting: Obstetrics & Gynecology

## 2017-08-09 ENCOUNTER — Other Ambulatory Visit: Payer: Self-pay | Admitting: Obstetrics & Gynecology

## 2017-08-10 ENCOUNTER — Other Ambulatory Visit: Payer: Self-pay | Admitting: Obstetrics & Gynecology

## 2017-08-10 ENCOUNTER — Encounter: Payer: Self-pay | Admitting: Obstetrics & Gynecology

## 2017-08-10 MED ORDER — OXYCODONE-ACETAMINOPHEN 10-325 MG PO TABS: 1.0000 | ORAL_TABLET | Freq: Four times a day (QID) | ORAL | 0 refills | Status: DC | PRN

## 2017-08-10 NOTE — Telephone Encounter (Signed)
Please advise 

## 2017-08-10 NOTE — Telephone Encounter (Signed)
Keep appt this week.  [facility] Let her know.

## 2017-08-12 ENCOUNTER — Telehealth: Payer: Self-pay | Admitting: Obstetrics & Gynecology

## 2017-08-12 NOTE — Telephone Encounter (Signed)
FYIRaynelle Mays: Regina Mays from [facility] called letting us know that patient taken her oxycodone prescription to different pharmacies to have it filled.   Cb# [phone]

## 2017-08-14 ENCOUNTER — Ambulatory Visit (INDEPENDENT_AMBULATORY_CARE_PROVIDER_SITE_OTHER): Payer: Medicaid Other | Admitting: Obstetrics & Gynecology

## 2017-08-14 ENCOUNTER — Encounter: Payer: Self-pay | Admitting: Obstetrics & Gynecology

## 2017-08-14 VITALS — BP 112/80 | HR 81 | Ht 61.0 in | Wt 175.0 lb

## 2017-08-14 DIAGNOSIS — N920 Excessive and frequent menstruation with regular cycle: Secondary | ICD-10-CM

## 2017-08-14 DIAGNOSIS — F1120 Opioid dependence, uncomplicated: Secondary | ICD-10-CM

## 2017-08-14 DIAGNOSIS — N946 Dysmenorrhea, unspecified: Secondary | ICD-10-CM

## 2017-08-14 DIAGNOSIS — G43829 Menstrual migraine, not intractable, without status migrainosus: Secondary | ICD-10-CM

## 2017-08-14 MED ORDER — OXYCODONE-ACETAMINOPHEN 10-325 MG PO TABS: 1.0000 | ORAL_TABLET | Freq: Four times a day (QID) | ORAL | 0 refills | Status: DC | PRN

## 2017-08-14 NOTE — Progress Notes (Signed)
  Postoperative Follow-up Patient presents post op from [facility] for abnormal uterine bleeding and pelvic pain, 2 weeks ago.  Subjective: Patient reports some improvement in her preop symptoms. Eating a regular diet without difficulty. Pain is controlled with current analgesics. Medications being used: ibuprofen (OTC) and narcotic analgesics including Percocet.  Activity: sedentary. Patient reports vaginal sx's of No bleeding.  Pain mostly in upper abd and RLQ below incision. No incisional concerns.  Objective: BP 112/80   Pulse 81   Ht 5\' 1"  (1.549 m)   Wt 175 lb (79.4 kg)   LMP 06/05/2017 (Exact Date)   BMI 33.07 kg/m  Physical Exam  Constitutional: She is oriented to person, place, and time. She appears well-developed and well-nourished. No distress.  Cardiovascular: Normal rate.  Pulmonary/Chest: Effort normal.  Abdominal: Soft. She exhibits no distension. There is no tenderness.  Incision Healing Well   Musculoskeletal: Normal range of motion.  Neurological: She is alert and oriented to person, place, and time. No cranial nerve deficit.  Skin: Skin is warm and dry.  Psychiatric: She has a normal mood and affect.   Assessment: s/p :  total laparoscopic hysterectomy with bilateral salpingectomy stable   Plan: Patient has done well after surgery with no apparent complications.  I have discussed the post-operative course to date, and the expected progress moving forward.  The patient understands what complications to be concerned about.  I will see the patient in routine follow up, or sooner if needed.    Activity plan: No heavy lifting. Pelvic rest.  Narcotic Rx and taper off discussed w pt.  Increase IBR to 800 mg PRN and esp in am, and try to limit Percocet more and more.   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 Understands will not be on Percocet long term  Regina Mays 08/14/2017, 9:38 AM

## 2017-08-21 ENCOUNTER — Other Ambulatory Visit: Payer: Self-pay | Admitting: Obstetrics & Gynecology

## 2017-08-21 ENCOUNTER — Encounter: Payer: Self-pay | Admitting: Obstetrics & Gynecology

## 2017-08-21 MED ORDER — OXYCODONE-ACETAMINOPHEN 5-325 MG PO TABS: 1.0000 | ORAL_TABLET | ORAL | 0 refills | Status: DC | PRN

## 2017-08-28 ENCOUNTER — Other Ambulatory Visit: Payer: Self-pay | Admitting: Obstetrics & Gynecology

## 2017-08-28 ENCOUNTER — Encounter: Payer: Self-pay | Admitting: Obstetrics & Gynecology

## 2017-08-28 MED ORDER — OXYCODONE-ACETAMINOPHEN 5-325 MG PO TABS: 1.0000 | ORAL_TABLET | ORAL | 0 refills | Status: DC | PRN

## 2017-08-28 NOTE — Telephone Encounter (Signed)
Please advise 

## 2017-08-31 ENCOUNTER — Other Ambulatory Visit: Payer: Self-pay | Admitting: Obstetrics & Gynecology

## 2017-09-01 ENCOUNTER — Encounter: Payer: Self-pay | Admitting: Obstetrics & Gynecology

## 2017-09-01 ENCOUNTER — Other Ambulatory Visit: Payer: Self-pay | Admitting: Obstetrics & Gynecology

## 2017-09-01 MED ORDER — OXYCODONE-ACETAMINOPHEN 10-325 MG PO TABS: 1.0000 | ORAL_TABLET | Freq: Three times a day (TID) | ORAL | 0 refills | Status: DC | PRN

## 2017-09-03 ENCOUNTER — Encounter: Payer: Self-pay | Admitting: Obstetrics & Gynecology

## 2017-09-04 NOTE — Telephone Encounter (Signed)
No Rx

## 2017-09-10 ENCOUNTER — Encounter: Payer: Self-pay | Admitting: Obstetrics & Gynecology

## 2017-09-10 NOTE — Telephone Encounter (Signed)
Please advise 

## 2017-09-11 ENCOUNTER — Encounter: Payer: Self-pay | Admitting: Obstetrics & Gynecology

## 2017-09-11 ENCOUNTER — Ambulatory Visit (INDEPENDENT_AMBULATORY_CARE_PROVIDER_SITE_OTHER): Payer: Medicaid Other | Admitting: Obstetrics & Gynecology

## 2017-09-11 VITALS — BP 120/80 | HR 88 | Ht 61.0 in | Wt 176.0 lb

## 2017-09-11 DIAGNOSIS — R102 Pelvic and perineal pain: Secondary | ICD-10-CM

## 2017-09-11 MED ORDER — OXYCODONE-ACETAMINOPHEN 10-325 MG PO TABS: 1.0000 | ORAL_TABLET | Freq: Three times a day (TID) | ORAL | 0 refills | Status: DC | PRN

## 2017-09-11 NOTE — Progress Notes (Signed)
  Postoperative Follow-up Patient presents post op from [facility] for pelvic pain, 6 weeks ago.  Subjective: Patient reports some improvement in her preop symptoms, still has same pain as preop; also has headaches. Eating a regular diet without difficulty. Pain is not well controlled.  Medications being used: ibuprofen (OTC) and narcotic analgesics including Percocet.  Activity: normal activities of daily living. Patient reports vaginal sx's of No bleeding.  Images from surgery:      Pathology: DIAGNOSIS:  A. UTERUS WITH CERVIX AND BILATERAL FALLOPIAN TUBES; HYSTERECTOMY WITH  BILATERAL SALPINGECTOMY:  - CHRONIC CERVICITIS.  - SECRETORY ENDOMETRIUM.  - BILATERAL FALLOPIAN TUBES WITH VASCULAR CONGESTION.  Objective: BP 120/80 (BP Location: Left Arm, Patient Position: Sitting, Cuff Size: Normal)   Pulse 88   Ht 5\' 1"  (1.549 m)   Wt 176 lb (79.8 kg)   LMP 06/05/2017 (Exact Date)   BMI 33.25 kg/m  Physical Exam  Constitutional: She is oriented to person, place, and time. She appears well-developed and well-nourished. No distress.  Genitourinary: Rectum normal and vagina normal. Pelvic exam was performed with patient supine. There is no rash, tenderness or lesion on the right labia. There is no rash, tenderness or lesion on the left labia. No erythema or bleeding in the vagina. Right adnexum does not display mass and does not display tenderness. Left adnexum does not display mass and does not display tenderness.  Genitourinary Comments: Cervix and uterus absent. Vaginal cuff healing well.  Cardiovascular: Normal rate.  Pulmonary/Chest: Effort normal.  Abdominal: Soft. She exhibits no distension. There is generalized tenderness. There is no rigidity, no rebound, no guarding, no tenderness at McBurney's point and negative Murphy's sign.  Incision healing well.  Musculoskeletal: Normal range of motion.  Neurological: She is alert and oriented to person, place, and time. No cranial nerve  deficit.  Skin: Skin is warm and dry.  Psychiatric: She has a normal mood and affect.   Assessment: s/p :  total laparoscopic hysterectomy with bilateral salpingectomy stable  Plan: Patient has done well after surgery with no apparent complications.  I have discussed the post-operative course to date, and the expected progress moving forward.  The patient understands what complications to be concerned about.  I will see the patient in routine follow up, or sooner if needed.    Activity plan: No restriction.  Pain clinic appt for CHRONIC PELVIC PAIN, as scheduled.  Narcotic dependence discussed  Letitia LibraRobert Paul Wake Conlee 09/11/2017, 9:30 AM

## 2017-09-21 ENCOUNTER — Encounter: Payer: Self-pay | Admitting: Obstetrics & Gynecology

## 2017-09-21 ENCOUNTER — Other Ambulatory Visit: Payer: Self-pay | Admitting: Obstetrics & Gynecology

## 2017-09-21 MED ORDER — OXYCODONE-ACETAMINOPHEN 10-325 MG PO TABS: 1.0000 | ORAL_TABLET | Freq: Three times a day (TID) | ORAL | 0 refills | Status: DC | PRN

## 2017-09-21 NOTE — Telephone Encounter (Signed)
Please advise 

## 2018-05-13 ENCOUNTER — Encounter: Payer: Self-pay | Admitting: Emergency Medicine

## 2018-05-13 DIAGNOSIS — R110 Nausea: Secondary | ICD-10-CM | POA: Insufficient documentation

## 2018-05-13 DIAGNOSIS — Z5321 Procedure and treatment not carried out due to patient leaving prior to being seen by health care provider: Secondary | ICD-10-CM | POA: Diagnosis not present

## 2018-05-13 NOTE — ED Triage Notes (Signed)
Pt c/o frontal H/A x2 days with nausea, no vomiting. Pt reports she has panic attacks and they normally start with HA like such.

## 2018-05-14 ENCOUNTER — Telehealth: Payer: Self-pay | Admitting: Emergency Medicine

## 2018-05-14 ENCOUNTER — Emergency Department
Admission: EM | Admit: 2018-05-14 | Discharge: 2018-05-14 | Disposition: A | Discharge status: Home / Self Care | Payer: Medicaid Other | Attending: Emergency Medicine | Admitting: Emergency Medicine

## 2018-05-14 NOTE — Telephone Encounter (Signed)
Called patient due to lwot to inquire about condition and follow up plans. Left message.   

## 2018-08-22 ENCOUNTER — Other Ambulatory Visit: Payer: Self-pay

## 2018-08-22 ENCOUNTER — Emergency Department
Admission: EM | Admit: 2018-08-22 | Discharge: 2018-08-23 | Disposition: A | Discharge status: Home / Self Care | Payer: Medicaid Other | Attending: Emergency Medicine | Admitting: Emergency Medicine

## 2018-08-22 DIAGNOSIS — T50901A Poisoning by unspecified drugs, medicaments and biological substances, accidental (unintentional), initial encounter: Secondary | ICD-10-CM | POA: Insufficient documentation

## 2018-08-22 DIAGNOSIS — Z79899 Other long term (current) drug therapy: Secondary | ICD-10-CM | POA: Diagnosis not present

## 2018-08-22 DIAGNOSIS — F17210 Nicotine dependence, cigarettes, uncomplicated: Secondary | ICD-10-CM | POA: Diagnosis not present

## 2018-08-22 DIAGNOSIS — R4182 Altered mental status, unspecified: Secondary | ICD-10-CM | POA: Diagnosis present

## 2018-08-22 DIAGNOSIS — F1410 Cocaine abuse, uncomplicated: Secondary | ICD-10-CM | POA: Diagnosis not present

## 2018-08-22 LAB — COMPREHENSIVE METABOLIC PANEL
ALT: 14 U/L (ref 0–44)
ANION GAP: 6 (ref 5–15)
AST: 13 U/L — ABNORMAL LOW (ref 15–41)
Albumin: 4.5 g/dL (ref 3.5–5.0)
Alkaline Phosphatase: 51 U/L (ref 38–126)
BUN: 22 mg/dL — ABNORMAL HIGH (ref 6–20)
CHLORIDE: 105 mmol/L (ref 98–111)
CO2: 25 mmol/L (ref 22–32)
CREATININE: 0.89 mg/dL (ref 0.44–1.00)
Calcium: 9 mg/dL (ref 8.9–10.3)
GFR calc non Af Amer: >60 mL/min (ref >60)
Glucose, Bld: 244 mg/dL — ABNORMAL HIGH (ref 70–99)
POTASSIUM: 3.6 mmol/L (ref 3.5–5.1)
SODIUM: 136 mmol/L (ref 135–145)
Total Bilirubin: 0.2 mg/dL — ABNORMAL LOW (ref 0.3–1.2)
Total Protein: 7.8 g/dL (ref 6.5–8.1)

## 2018-08-22 LAB — CBC WITH DIFFERENTIAL/PLATELET
ABS IMMATURE GRANULOCYTES: 0.04 10*3/uL (ref 0.00–0.07)
BASOS PCT: 1 %
Basophils Absolute: 0.1 10*3/uL (ref 0.0–0.1)
EOS ABS: 0.3 10*3/uL (ref 0.0–0.5)
Eosinophils Relative: 3 %
HCT: 37.8 % (ref 36.0–46.0)
Hemoglobin: 12.1 g/dL (ref 12.0–15.0)
Immature Granulocytes: 0 %
LYMPHS PCT: 20 %
Lymphs Abs: 2.6 10*3/uL (ref 0.7–4.0)
MCH: 27.9 pg (ref 26.0–34.0)
MCHC: 32 g/dL (ref 30.0–36.0)
MCV: 87.1 fL (ref 80.0–100.0)
MONOS PCT: 6 %
Monocytes Absolute: 0.7 10*3/uL (ref 0.1–1.0)
NEUTROS ABS: 9.2 10*3/uL — AB (ref 1.7–7.7)
Neutrophils Relative %: 70 %
PLATELETS: 418 10*3/uL — AB (ref 150–400)
RBC: 4.34 MIL/uL (ref 3.87–5.11)
RDW: 14 % (ref 11.5–15.5)
WBC: 13 10*3/uL — ABNORMAL HIGH (ref 4.0–10.5)
nRBC: 0 % (ref 0.0–0.2)

## 2018-08-22 LAB — URINE DRUG SCREEN, QUALITATIVE (ARMC ONLY)
AMPHETAMINES, UR SCREEN: NOT DETECTED
BARBITURATES, UR SCREEN: NOT DETECTED
Benzodiazepine, Ur Scrn: NOT DETECTED
COCAINE METABOLITE, UR ~~LOC~~: POSITIVE — AB
Cannabinoid 50 Ng, Ur ~~LOC~~: NOT DETECTED
MDMA (ECSTASY) UR SCREEN: NOT DETECTED
METHADONE SCREEN, URINE: NOT DETECTED
Opiate, Ur Screen: NOT DETECTED
Phencyclidine (PCP) Ur S: NOT DETECTED
TRICYCLIC, UR SCREEN: NOT DETECTED

## 2018-08-22 MED ORDER — NALOXONE HCL 2 MG/2ML IJ SOSY
PREFILLED_SYRINGE | INTRAMUSCULAR | Status: AC
  Filled 2018-08-22: qty 2

## 2018-08-22 MED ORDER — NALOXONE HCL 2 MG/2ML IJ SOSY
0.4000 mg | PREFILLED_SYRINGE | Freq: Once | INTRAMUSCULAR | Status: AC
  Administered 2018-08-22: 0.4 mg via INTRAVENOUS

## 2018-08-22 MED ORDER — SODIUM CHLORIDE 0.9 % IV BOLUS
1000.0000 mL | Freq: Once | INTRAVENOUS | Status: AC
  Administered 2018-08-22: 1000 mL via INTRAVENOUS

## 2018-08-22 MED ORDER — ONDANSETRON HCL 4 MG/2ML IJ SOLN
4.0000 mg | Freq: Once | INTRAMUSCULAR | Status: AC
  Administered 2018-08-22: 4 mg via INTRAVENOUS
  Filled 2018-08-22: qty 2

## 2018-08-22 NOTE — ED Notes (Signed)
Pt provided cool rag

## 2018-08-22 NOTE — ED Notes (Signed)
Pt x1 episode vomiting. Pt provided lemon swabs

## 2018-08-22 NOTE — ED Notes (Signed)
Poison control not contacted- per Dr. Marisa Severin, we do not know exactly what pt ingested.  Pt has also changed story on method of ingestion and color of substance

## 2018-08-22 NOTE — ED Triage Notes (Signed)
PT to ed [facility]. Pt snorted a "little orange" pill that  her friend told her was suboxone. PT was unconscious when EMS arrived. PT was given 4 mg Narcan. Pt alert and oriented at this time. Vomit x2 times en route.

## 2018-08-22 NOTE — ED Notes (Signed)
Pt's monitor alarming; sats 60%; in to check on pt; she was sleeping, cyanotic; easily awakened; sats slowly increased to low 90/s

## 2018-08-22 NOTE — ED Provider Notes (Signed)
[facility] Emergency Department Provider Note ____________________________________________   First MD Initiated Contact with Patient 08/22/18 2040     (approximate)  I have reviewed the triage vital signs and the nursing notes.   HISTORY  Chief Complaint Ingestion    HPI Regina Mays is a 33 y.o. female with PMH as noted below and who is on Suboxone who presents after an apparent overdose of unknown medication or illicit drug.  The patient states that she could not find her Suboxone strips and was given a pill by a friend of hers which she was told was Suboxone.  The patient initially reported snorting it although she denied this to me.  She then apparently passed out and when EMS gave her Narcan she regained consciousness.  She has had nausea and vomiting since then.  She denies other acute symptoms.  She denies any intentional illicit drug use.  She has no SI or HI.   Past Medical History:  Diagnosis Date  . Anxiety    NO MEDS  . Closed left ankle fracture    original injury 2014--- reinjured 09/ 2016  . Complication of anesthesia    "fear not waking up"-PT STATES SHE WAS VERY AGITATED WHEN SHE WOKE UP AFTER WISDONM TEETH  . Headache    MIGRAINES  . Left ankle pain   . Left ankle swelling   . Panic attacks     Patient Active Problem List   Diagnosis Date Noted  . Narcotic dependence (HCC) 08/14/2017  . Device, implant, or graft complication 07/23/2017  . Dysmenorrhea 12/26/2016  . Menorrhagia with regular cycle 12/26/2016  . Menstrual migraine without status migrainosus, not intractable 12/26/2016    Past Surgical History:  Procedure Laterality Date  . CYSTOSCOPY N/A 07/30/2017   Procedure: CYSTOSCOPY;  Surgeon: Nadara MustardHarris, Robert P, MD;  Location: [facility] ORS;  Service: Gynecology;  Laterality: N/A;  . LAPAROSCOPIC HYSTERECTOMY Bilateral 07/30/2017   Procedure: HYSTERECTOMY TOTAL LAPAROSCOPIC BILATERAL SALPINGECTOMY;  Surgeon: Nadara MustardHarris, Robert  P, MD;  Location: [facility] ORS;  Service: Gynecology;  Laterality: Bilateral;  . LEG SURGERY Left 2016   ankle was broken. shifted bones to grow back together  . TUBAL LIGATION  2007  . WISDOM TOOTH EXTRACTION      Prior to Admission medications   Medication Sig Start Date End Date Taking? Authorizing Provider  QUEtiapine (SEROQUEL) 100 MG tablet Take 100 mg by mouth at bedtime. 08/11/18  Yes [provider]  SUBOXONE 8-2 MG FILM DISSOLVE ONE FILM UNDER THE TONGUE IN THE MORNING AND IN THE EVENING 08/11/18  Yes [provider]    Allergies Penicillins; Hydrocodone; Toradol [ketorolac tromethamine]; and Tramadol  Family History  Problem Relation Age of Onset  . Hypertension Mother   . Cancer Mother   . Hypertension Father   . Diabetes Maternal Grandmother     Social History Social History   Tobacco Use  . Smoking status: Current Every Day Smoker    Packs/day: 0.25    Years: 4.00    Pack years: 1.00    Types: Cigarettes  . Smokeless tobacco: Never Used  Substance Use Topics  . Alcohol use: No  . Drug use: No    Comment: suboxone     Review of Systems  Constitutional: No fever. Eyes: No redness. ENT: No sore throat. Cardiovascular: Denies chest pain. Respiratory: Denies shortness of breath. Gastrointestinal: Positive for vomiting.  Genitourinary: Negative for flank pain.  Musculoskeletal: Negative for back pain. Skin: Negative for rash.  Neurological: Negative for headache.   ____________________________________________   PHYSICAL EXAM:  VITAL SIGNS: ED Triage Vitals  Enc Vitals Group     BP 08/22/18 2030 (!) 111/58     Pulse Rate 08/22/18 2030 93     Resp 08/22/18 2030 13     Temp --      Temp src --      SpO2 08/22/18 2024 97 %     Weight 08/22/18 2027 135 lb (61.2 kg)     Height 08/22/18 2027 5\' 1"  (1.549 m)     Head Circumference --      Peak Flow --      Pain Score 08/22/18 2027 0     Pain Loc --      Pain Edu? --      Excl. in GC?  --     Constitutional: Alert and oriented.  Relatively well appearing and in no acute distress. Eyes: Conjunctivae are normal.  No scleral icterus. Head: Atraumatic. Nose: No congestion/rhinnorhea. Mouth/Throat: Mucous membranes are slightly dry.   Neck: Normal range of motion.  Cardiovascular: Good peripheral circulation. Respiratory: Normal respiratory effort.  No retractions.  Gastrointestinal: No distention.  Musculoskeletal: Extremities warm and well perfused.  Neurologic:  Normal speech and language.  Motor intact in all extremities.  No gross focal neurologic deficits are appreciated.  Skin:  Skin is warm and dry. No rash noted. Psychiatric: Mood and affect are normal. Speech and behavior are normal.  ____________________________________________   LABS (all labs ordered are listed, but only abnormal results are displayed)  Labs Reviewed  CBC WITH DIFFERENTIAL/PLATELET - Abnormal; Notable for the following components:      Result Value   WBC 13.0 (*)    Platelets 418 (*)    Neutro Abs 9.2 (*)    All other components within normal limits  COMPREHENSIVE METABOLIC PANEL - Abnormal; Notable for the following components:   Glucose, Bld 244 (*)    BUN 22 (*)    AST 13 (*)    Total Bilirubin 0.2 (*)    All other components within normal limits  URINE DRUG SCREEN, QUALITATIVE ([facility] ONLY)   ____________________________________________  EKG  ED ECG REPORT I, Dionne Bucy, the attending physician, personally viewed and interpreted this ECG.  Date: 08/22/2018 EKG Time: 2029 Rate: 94 Rhythm: normal sinus rhythm QRS Axis: normal Intervals: normal ST/T Wave abnormalities: normal Narrative Interpretation: no evidence of acute ischemia  ____________________________________________  RADIOLOGY    ____________________________________________   PROCEDURES  Procedure(s) performed: No  Procedures  Critical Care performed:  No ____________________________________________   INITIAL IMPRESSION / ASSESSMENT AND PLAN / ED COURSE  Pertinent labs & imaging results that were available during my care of the patient were reviewed by me and considered in my medical decision making (see chart for details).  33 year old female with PMH as noted above and who is on Suboxone presents after an apparent overdose of unknown medication or illicit drug.  The patient states that she took a pill from a friend.  She initially admitted to snorting it although denied this to me.  She denies any intentional illicit drug use.  She was found unconscious but awoke when EMS gave her Narcan.  On exam the patient is relatively well-appearing although she has some intermittent vomiting.  Her vital signs are normal.  The remainder of the exam is as described above.  Overall I suspect most likely overdose of another opiate such as fentanyl.  We will observe the patient for  approximately 3 hours to give the Narcan adequate time to wear off.  I will also obtain basic labs and give the patient fluids and Zofran for symptomatic treatment.  If she remains asymptomatic and stable, I anticipate discharge home.  ----------------------------------------- 11:04 PM on 08/22/2018 -----------------------------------------  Patient had an episode where she was asleep and O2 saturation was noted to be low.  The patient was immediately awakened.  We gave another dose of Narcan and she is now awake and alert.  At this time, the plan will be to continue to observe the patient in the ED.  If she continues to have episodes of decreased respiratory drive and requires further Narcan, she may need admission.  I am signing the patient out to the oncoming physician Dr. Dolores FrameSung. ____________________________________________   FINAL CLINICAL IMPRESSION(S) / ED DIAGNOSES  Final diagnoses:  Accidental drug overdose, initial encounter      NEW MEDICATIONS STARTED DURING  THIS VISIT:  New Prescriptions   No medications on file     Note:  This document was prepared using Dragon voice recognition software and may include unintentional dictation errors.    Dionne BucySiadecki, Charlsie Fleeger, MD 08/22/18 [id number]

## 2018-08-22 NOTE — ED Notes (Signed)
Pt awake, sitting up in bed and talking to officer at this time

## 2018-08-23 LAB — BLOOD GAS, ARTERIAL
Acid-Base Excess: 0.3 mmol/L (ref 0.0–2.0)
Bicarbonate: 25.4 mmol/L (ref 20.0–28.0)
FIO2: 0.21
O2 Saturation: 96.8 %
Patient temperature: 37
pCO2 arterial: 42 mmHg (ref 32.0–48.0)
pH, Arterial: 7.39 (ref 7.350–7.450)
pO2, Arterial: 89 mmHg (ref 83.0–108.0)

## 2018-08-23 MED ORDER — NALOXONE HCL 4 MG/0.1ML NA LIQD
1.0000 | Freq: Once | NASAL | Status: AC
  Administered 2018-08-23: 1 via NASAL
  Filled 2018-08-23: qty 4

## 2018-08-23 NOTE — ED Provider Notes (Signed)
-----------------------------------------   1:50 AM on 08/23/2018 -----------------------------------------  Patient is approaching the 6-hour ED observation mark.  She is awake, has eaten a sandwich tray and talking to a friend.  Pulse oximeter is not reliable because it is on the finger that has fingernail polish.  Will try to either remove the polish or attached.  Probe to the ear.  We will also obtain ABG on room air to assess oxygenation.   ----------------------------------------- 4:15 AM on 08/23/2018 -----------------------------------------  ABG on room air within normal limits.  Patient is awake, alert, not hypoxic.  Smiling at me and ready for discharge home.  She has been observed in the emergency department for 8 hours.  Feel she will be safe for discharge home.  Strict return precautions given.  Patient verbalizes understanding and agrees with plan of care.   Irean HongSung, Diera Wirkkala J, MD 08/23/18 0530

## 2018-08-23 NOTE — Discharge Instructions (Signed)
Do not use illegal drugs.  Return to the ER for worsening symptoms, persistent vomiting, lethargy, difficulty breathing or other concerns.

## 2022-05-20 ENCOUNTER — Encounter: Payer: Self-pay | Admitting: Unknown Physician Specialty

## 2022-05-23 ENCOUNTER — Ambulatory Visit: Payer: Medicaid Other | Admitting: General Practice

## 2022-05-23 ENCOUNTER — Encounter
Admission: RE | Disposition: A | Discharge status: Home / Self Care | Payer: Self-pay | Source: Home / Self Care | Attending: Unknown Physician Specialty

## 2022-05-23 ENCOUNTER — Encounter: Payer: Self-pay | Admitting: Unknown Physician Specialty

## 2022-05-23 ENCOUNTER — Ambulatory Visit
Admission: RE | Admit: 2022-05-23 | Discharge: 2022-05-23 | Disposition: A | Discharge status: Home / Self Care | Payer: Medicaid Other | Attending: Unknown Physician Specialty | Admitting: Unknown Physician Specialty

## 2022-05-23 ENCOUNTER — Other Ambulatory Visit: Payer: Self-pay

## 2022-05-23 DIAGNOSIS — J340 Abscess, furuncle and carbuncle of nose: Secondary | ICD-10-CM | POA: Diagnosis not present

## 2022-05-23 DIAGNOSIS — F419 Anxiety disorder, unspecified: Secondary | ICD-10-CM | POA: Diagnosis not present

## 2022-05-23 DIAGNOSIS — K055 Other periodontal diseases: Secondary | ICD-10-CM | POA: Insufficient documentation

## 2022-05-23 DIAGNOSIS — M3130 Wegener's granulomatosis without renal involvement: Secondary | ICD-10-CM | POA: Insufficient documentation

## 2022-05-23 DIAGNOSIS — G43909 Migraine, unspecified, not intractable, without status migrainosus: Secondary | ICD-10-CM | POA: Insufficient documentation

## 2022-05-23 DIAGNOSIS — K029 Dental caries, unspecified: Secondary | ICD-10-CM | POA: Insufficient documentation

## 2022-05-23 DIAGNOSIS — S025XXA Fracture of tooth (traumatic), initial encounter for closed fracture: Secondary | ICD-10-CM | POA: Diagnosis not present

## 2022-05-23 DIAGNOSIS — F17200 Nicotine dependence, unspecified, uncomplicated: Secondary | ICD-10-CM | POA: Insufficient documentation

## 2022-05-23 HISTORY — DX: Presence of dental prosthetic device (complete) (partial): Z97.2

## 2022-05-23 HISTORY — PX: NASAL ENDOSCOPY: SHX6577

## 2022-05-23 HISTORY — DX: Opioid abuse, in remission: F11.11

## 2022-05-23 SURGERY — EXAM UNDER ANESTHESIA
Anesthesia: General

## 2022-05-23 MED ORDER — LIDOCAINE HCL (CARDIAC) PF 100 MG/5ML IV SOSY
PREFILLED_SYRINGE | INTRAVENOUS | Status: DC | PRN
  Administered 2022-05-23: 50 mg via INTRAVENOUS

## 2022-05-23 MED ORDER — DEXAMETHASONE SODIUM PHOSPHATE 10 MG/ML IJ SOLN
INTRAMUSCULAR | Status: DC | PRN
  Administered 2022-05-23: 4 mg via INTRAVENOUS

## 2022-05-23 MED ORDER — DROPERIDOL 2.5 MG/ML IJ SOLN: 0.6250 mg | Freq: Once | INTRAMUSCULAR | Status: DC | PRN

## 2022-05-23 MED ORDER — PROMETHAZINE HCL 25 MG/ML IJ SOLN: 6.2500 mg | INTRAMUSCULAR | Status: DC | PRN

## 2022-05-23 MED ORDER — IBUPROFEN 600 MG PO TABS
600.0000 mg | ORAL_TABLET | Freq: Once | ORAL | Status: AC
  Administered 2022-05-23: 600 mg via ORAL

## 2022-05-23 MED ORDER — OXYCODONE HCL 5 MG/5ML PO SOLN: 5.0000 mg | Freq: Once | ORAL | Status: DC | PRN

## 2022-05-23 MED ORDER — HYDROMORPHONE HCL 1 MG/ML IJ SOLN: 0.2500 mg | INTRAMUSCULAR | Status: DC | PRN

## 2022-05-23 MED ORDER — PHENYLEPHRINE HCL 1 % NA SOLN
NASAL | Status: DC | PRN
  Administered 2022-05-23: 2 [drp] via NASAL

## 2022-05-23 MED ORDER — LACTATED RINGERS IV SOLN: INTRAVENOUS | Status: DC

## 2022-05-23 MED ORDER — SUCCINYLCHOLINE CHLORIDE 200 MG/10ML IV SOSY
PREFILLED_SYRINGE | INTRAVENOUS | Status: DC | PRN
  Administered 2022-05-23: 100 mg via INTRAVENOUS

## 2022-05-23 MED ORDER — FENTANYL CITRATE PF 50 MCG/ML IJ SOSY: 25.0000 ug | PREFILLED_SYRINGE | INTRAMUSCULAR | Status: DC | PRN

## 2022-05-23 MED ORDER — ACETAMINOPHEN 10 MG/ML IV SOLN: 1000.0000 mg | Freq: Once | INTRAVENOUS | Status: DC | PRN

## 2022-05-23 MED ORDER — PROPOFOL 10 MG/ML IV BOLUS
INTRAVENOUS | Status: DC | PRN
  Administered 2022-05-23: 200 mg via INTRAVENOUS

## 2022-05-23 MED ORDER — FENTANYL CITRATE (PF) 100 MCG/2ML IJ SOLN
INTRAMUSCULAR | Status: DC | PRN
  Administered 2022-05-23 (×2): 100 ug via INTRAVENOUS

## 2022-05-23 MED ORDER — ONDANSETRON HCL 4 MG/2ML IJ SOLN
INTRAMUSCULAR | Status: DC | PRN
  Administered 2022-05-23: 4 mg via INTRAVENOUS

## 2022-05-23 MED ORDER — MIDAZOLAM HCL 2 MG/2ML IJ SOLN
INTRAMUSCULAR | Status: DC | PRN
  Administered 2022-05-23: 2 mg via INTRAVENOUS

## 2022-05-23 MED ORDER — ACETAMINOPHEN 500 MG PO TABS
1000.0000 mg | ORAL_TABLET | Freq: Once | ORAL | Status: AC
  Administered 2022-05-23: 1000 mg via ORAL

## 2022-05-23 MED ORDER — OXYCODONE HCL 5 MG PO TABS: 5.0000 mg | ORAL_TABLET | Freq: Once | ORAL | Status: DC | PRN

## 2022-05-23 SURGICAL SUPPLY — 9 items
CANISTER SUCT 1200ML W/VALVE (MISCELLANEOUS) IMPLANT
DRSG TELFA 3X4 N-ADH STERILE (GAUZE/BANDAGES/DRESSINGS) IMPLANT
GLOVE SURG ENC TEXT LTX SZ7.5 (GLOVE) ×1 IMPLANT
HANDLE SUCTION POOLE (INSTRUMENTS) IMPLANT
KIT TURNOVER KIT A (KITS) ×1 IMPLANT
PACK ENT CUSTOM (PACKS) IMPLANT
STRAP BODY AND KNEE 60X3 (MISCELLANEOUS) ×1 IMPLANT
SUCTION POOLE HANDLE (INSTRUMENTS) ×1
TOWEL OR 17X26 4PK STRL BLUE (TOWEL DISPOSABLE) ×1 IMPLANT

## 2022-05-23 NOTE — Transfer of Care (Signed)
Immediate Anesthesia Transfer of Care Note  Patient: Regina Mays  Procedure(s) Performed: [name] UNDER ANESTHESIA NASAL ENDOSCOPIC BIOPSY OF NASAL CAVIY AND SOFT PALATE  Patient Location: PACU  Anesthesia Type:General  Level of Consciousness: drowsy  Airway & Oxygen Therapy: Patient Spontanous Breathing and Patient connected to face mask oxygen  Post-op Assessment: Report given to RN and Post -op Vital signs reviewed and stable  Post vital signs: stable  Last Vitals:  Vitals Value Taken Time  BP 127/63 05/23/22 1356  Temp    Pulse 84 05/23/22 1358  Resp 11 05/23/22 1358  SpO2 98 % 05/23/22 1358  Vitals shown include unvalidated device data.  Last Pain:  Vitals:   05/23/22 1109  TempSrc: Temporal  PainSc: 0-No pain         Complications: No notable events documented.

## 2022-05-23 NOTE — Op Note (Signed)
05/23/2022  1:44 PM    Regina Mays  [id number]   Pre-Op Dx: cellulitis of nose, wegener's  Post-op Dx: SAME  Proc: Exam under anesthesia of the nose and oral cavity.  Endoscopic biopsy of left nasal cavity, biopsy of oropharynx.  Surg:  Roena Malady  Anes:  GOT  EBL: Less than 5 cc  Comp: None  Findings: Significant inflammatory disease throughout the left nostril, erosion of the inferior turbinate on the left, erosion of posterior nasal septum, erosion of soft palate.  Significant debris the left nasal cavity.  Procedure: [name] was identified in the holding area take the operating placed in supine position.  After general trach anesthesia table was turned 90 degrees.  Cottonoid pledgets with phenylephrine lidocaine solution were placed within each nostril.  After approximately 5 minutes these were removed.  0 degree endoscope was introduced into the field the right side of the nose showed mild inflammation but no significant debris or damage.  Examining posteriorly there was significant erosion of the posterior part of the septum.  Examination of the left side of the nose showed significant inflammatory disease circumferentially both nasal septum on the lateral nasal wall the inferior turbinate appeared to be eroded.  The nasopharynx also had significant debris which was cleansed, was erosion of the mucosa of the nasopharynx as well.  There is also significant whitish debris within the nasal cavity this was suctioned free and irrigated using saline.  Biopsies were then taken of the anterior tip of the middle turbinate, nasopharynx, posterior nasal septum.  Culture was also taken from the nasopharynx.  The cottonoid pledget was then replaced on the left and left approximately 5 minutes after which it was removed.  The table was then laid flat and a mouthgag was inserted into the oral cavity.  She had severe periodontal disease multiple broken and rotten teeth, these were protected  and no more broken.  The mouthgag was opened and examination of the oropharynx showed near complete erosion of the soft palate as well as the mucosa of the nasopharynx extending into the upper part of the posterior pharynx.  This was gently suctioned free.  Abscess was then taken from the edge of the remainder of the soft palate, and taken from the posterior pharyngeal wall.  There was minimal bleeding.  All pledgets were accounted for therefore the patient was then returned to anesthesia where she was awakened in the operating room taken to cover room in stable condition.  Culture: Nasopharynx  Specimen: #1 middle turbinate #2 nasopharynx #3 posterior septum #4 soft palate #5 posterior pharynx  Dispo:   Good  Plan: Discharged home follow-up 1 week continue GU irrigant and saline irrigation  Roena Malady  05/23/2022 1:44 PM

## 2022-05-23 NOTE — Anesthesia Preprocedure Evaluation (Addendum)
Anesthesia Evaluation  Patient identified by MRN, date of birth, ID band Patient awake    Reviewed: Allergy & Precautions, NPO status , Patient's Chart, lab work & pertinent test results  Airway Mallampati: I  TM Distance: >3 FB Neck ROM: full   Comment: Erosion to posterior pharynx and soft palate Dental no notable dental hx.    Pulmonary Current SmokerPatient did not abstain from smoking.,    Pulmonary exam normal        Cardiovascular negative cardio ROS Normal cardiovascular exam     Neuro/Psych PSYCHIATRIC DISORDERS Anxiety negative neurological ROS     GI/Hepatic negative GI ROS, Neg liver ROS,   Endo/Other  negative endocrine ROS  Renal/GU negative Renal ROS  negative genitourinary   Musculoskeletal   Abdominal Normal abdominal exam  (+)   Peds  Hematology negative hematology ROS (+)   Anesthesia Other Findings Denies recent substance use. Concern for wegener's.   Past Medical History: No date: Anxiety     Comment:  NO MEDS No date: Closed left ankle fracture     Comment:  original injury 2014--- reinjured 09/ 8182 No date: Complication of anesthesia     Comment:  "fear not waking up"-PT STATES SHE WAS VERY AGITATED               WHEN SHE WOKE UP AFTER WISDONM TEETH No date: Headache     Comment:  MIGRAINES No date: Hx of opioid abuse ([place])     Comment:  Opiod/cocaine.  In rehab No date: Left ankle pain No date: Left ankle swelling No date: Panic attacks No date: Wears dentures     Comment:  partial upper  Past Surgical History: 07/30/2017: CYSTOSCOPY; N/A     Comment:  Procedure: CYSTOSCOPY;  Surgeon: Gae Dry, MD;                Location: [facility] ORS;  Service: Gynecology;  Laterality:               N/A; 07/30/2017: LAPAROSCOPIC HYSTERECTOMY; Bilateral     Comment:  Procedure: HYSTERECTOMY TOTAL LAPAROSCOPIC BILATERAL               SALPINGECTOMY;  Surgeon: Gae Dry, MD;   Location:              [facility] ORS;  Service: Gynecology;  Laterality: Bilateral; 2016: LEG SURGERY; Left     Comment:  ankle was broken. shifted bones to grow back together 2007: TUBAL LIGATION No date: WISDOM TOOTH EXTRACTION  BMI    Body Mass Index: 36.09 kg/m      Reproductive/Obstetrics negative OB ROS                          Anesthesia Physical Anesthesia Plan  ASA: 2  Anesthesia Plan: General   Post-op Pain Management: Tylenol PO (pre-op) and Dilaudid IV   Induction: Intravenous  PONV Risk Score and Plan: 3 and Ondansetron, Dexamethasone and Midazolam  Airway Management Planned: Oral ETT  Additional Equipment:   Intra-op Plan:   Post-operative Plan: Extubation in OR  Informed Consent: I have reviewed the patients History and Physical, chart, labs and discussed the procedure including the risks, benefits and alternatives for the proposed anesthesia with the patient or authorized representative who has indicated his/her understanding and acceptance.     Dental Advisory Given  Plan Discussed with: Anesthesiologist, CRNA and Surgeon  Anesthesia Plan Comments:  Anesthesia Quick Evaluation  

## 2022-05-23 NOTE — Anesthesia Procedure Notes (Signed)
Procedure Name: Intubation Date/Time: 05/23/2022 1:14 PM  Performed by: Jacqualin Combes, CRNAPre-anesthesia Checklist: Patient identified, Emergency Drugs available, Suction available and Patient being monitored Patient Re-evaluated:Patient Re-evaluated prior to induction Oxygen Delivery Method: Circle system utilized Preoxygenation: Pre-oxygenation with 100% oxygen Induction Type: IV induction Ventilation: Mask ventilation without difficulty Laryngoscope Size: Mac and 4 Grade View: Grade II Tube type: Oral Rae (Cords clear. CA) Tube size: 7.0 mm Number of attempts: 1 Airway Equipment and Method: Oral airway Placement Confirmation: ETT inserted through vocal cords under direct vision, positive ETCO2 and breath sounds checked- equal and bilateral Secured at: 21 cm Tube secured with: Tape Dental Injury: Teeth and Oropharynx as per pre-operative assessment

## 2022-05-23 NOTE — H&P (Signed)
The patient's history has been reviewed, patient examined, no change in status, stable for surgery.  Questions were answered to the patients satisfaction.  

## 2022-05-23 NOTE — Anesthesia Postprocedure Evaluation (Signed)
Anesthesia Post Note  Patient: Regina Mays  Procedure(s) Performed: [name] UNDER ANESTHESIA NASAL ENDOSCOPIC BIOPSY OF NASAL CAVIY AND SOFT PALATE  Patient location during evaluation: Endoscopy Anesthesia Type: General Level of consciousness: awake and alert Pain management: pain level controlled Vital Signs Assessment: post-procedure vital signs reviewed and stable Respiratory status: spontaneous breathing, nonlabored ventilation and respiratory function stable Cardiovascular status: blood pressure returned to baseline and stable Postop Assessment: no apparent nausea or vomiting Anesthetic complications: no   No notable events documented.   Last Vitals:  Vitals:   05/23/22 1358 05/23/22 1411  BP: 127/63 120/71  Pulse: 83 69  Resp: 12 13  Temp: (!) 36.4 C (!) 36.4 C  SpO2: 100% 100%    Last Pain:  Vitals:   05/23/22 1411  TempSrc:   PainSc: 0-No pain                 [place] Ouch

## 2022-05-26 ENCOUNTER — Encounter: Payer: Self-pay | Admitting: Unknown Physician Specialty

## 2022-05-28 LAB — AEROBIC/ANAEROBIC CULTURE W GRAM STAIN (SURGICAL/DEEP WOUND)
Culture: NO GROWTH
Gram Stain: NONE SEEN

## 2022-06-06 LAB — SURGICAL PATHOLOGY

## 2022-06-09 DIAGNOSIS — Z7960 Long term (current) use of unspecified immunomodulators and immunosuppressants: Secondary | ICD-10-CM

## 2022-06-09 DIAGNOSIS — Z7952 Long term (current) use of systemic steroids: Secondary | ICD-10-CM | POA: Insufficient documentation

## 2022-06-09 DIAGNOSIS — M3130 Wegener's granulomatosis without renal involvement: Secondary | ICD-10-CM | POA: Insufficient documentation

## 2022-06-09 HISTORY — DX: Long term (current) use of systemic steroids: Z79.52

## 2022-06-09 HISTORY — DX: Wegener's granulomatosis without renal involvement: M31.30

## 2022-06-09 HISTORY — DX: Long term (current) use of unspecified immunomodulators and immunosuppressants: Z79.60

## 2022-11-05 ENCOUNTER — Ambulatory Visit: Payer: Medicaid Other | Admitting: Family

## 2023-04-10 ENCOUNTER — Inpatient Hospital Stay
Admission: EM | Admit: 2023-04-10 | Discharge: 2023-04-13 | DRG: 872 | Disposition: A | Discharge status: Home / Self Care | Payer: Medicaid Other | Attending: Internal Medicine | Admitting: Internal Medicine

## 2023-04-10 ENCOUNTER — Emergency Department: Payer: Medicaid Other

## 2023-04-10 ENCOUNTER — Other Ambulatory Visit: Payer: Self-pay

## 2023-04-10 DIAGNOSIS — F419 Anxiety disorder, unspecified: Secondary | ICD-10-CM | POA: Insufficient documentation

## 2023-04-10 DIAGNOSIS — Z79899 Other long term (current) drug therapy: Secondary | ICD-10-CM

## 2023-04-10 DIAGNOSIS — G894 Chronic pain syndrome: Secondary | ICD-10-CM | POA: Diagnosis present

## 2023-04-10 DIAGNOSIS — J0100 Acute maxillary sinusitis, unspecified: Secondary | ICD-10-CM | POA: Diagnosis present

## 2023-04-10 DIAGNOSIS — J320 Chronic maxillary sinusitis: Secondary | ICD-10-CM | POA: Diagnosis present

## 2023-04-10 DIAGNOSIS — K029 Dental caries, unspecified: Secondary | ICD-10-CM | POA: Diagnosis present

## 2023-04-10 DIAGNOSIS — Z880 Allergy status to penicillin: Secondary | ICD-10-CM

## 2023-04-10 DIAGNOSIS — R1011 Right upper quadrant pain: Secondary | ICD-10-CM | POA: Diagnosis not present

## 2023-04-10 DIAGNOSIS — Z885 Allergy status to narcotic agent status: Secondary | ICD-10-CM

## 2023-04-10 DIAGNOSIS — Z833 Family history of diabetes mellitus: Secondary | ICD-10-CM

## 2023-04-10 DIAGNOSIS — R1013 Epigastric pain: Secondary | ICD-10-CM | POA: Diagnosis not present

## 2023-04-10 DIAGNOSIS — F17210 Nicotine dependence, cigarettes, uncomplicated: Secondary | ICD-10-CM | POA: Diagnosis present

## 2023-04-10 DIAGNOSIS — J019 Acute sinusitis, unspecified: Secondary | ICD-10-CM | POA: Diagnosis present

## 2023-04-10 DIAGNOSIS — T402X5A Adverse effect of other opioids, initial encounter: Secondary | ICD-10-CM | POA: Diagnosis not present

## 2023-04-10 DIAGNOSIS — D849 Immunodeficiency, unspecified: Secondary | ICD-10-CM

## 2023-04-10 DIAGNOSIS — F32A Depression, unspecified: Secondary | ICD-10-CM | POA: Diagnosis present

## 2023-04-10 DIAGNOSIS — A419 Sepsis, unspecified organism: Principal | ICD-10-CM

## 2023-04-10 DIAGNOSIS — R112 Nausea with vomiting, unspecified: Secondary | ICD-10-CM

## 2023-04-10 DIAGNOSIS — Z8249 Family history of ischemic heart disease and other diseases of the circulatory system: Secondary | ICD-10-CM

## 2023-04-10 DIAGNOSIS — Z888 Allergy status to other drugs, medicaments and biological substances status: Secondary | ICD-10-CM

## 2023-04-10 DIAGNOSIS — K047 Periapical abscess without sinus: Secondary | ICD-10-CM | POA: Insufficient documentation

## 2023-04-10 LAB — CBC
HCT: 36.8 % (ref 36.0–46.0)
Hemoglobin: 11.9 g/dL — ABNORMAL LOW (ref 12.0–15.0)
MCH: 27.7 pg (ref 26.0–34.0)
MCHC: 32.3 g/dL (ref 30.0–36.0)
MCV: 85.8 fL (ref 80.0–100.0)
Platelets: 588 10*3/uL — ABNORMAL HIGH (ref 150–400)
RBC: 4.29 MIL/uL (ref 3.87–5.11)
RDW: 15.6 % — ABNORMAL HIGH (ref 11.5–15.5)
WBC: 25.1 10*3/uL — ABNORMAL HIGH (ref 4.0–10.5)
nRBC: 0 % (ref 0.0–0.2)

## 2023-04-10 LAB — COMPREHENSIVE METABOLIC PANEL
ALT: 18 U/L (ref 0–44)
AST: 8 U/L — ABNORMAL LOW (ref 15–41)
Albumin: 4.4 g/dL (ref 3.5–5.0)
Alkaline Phosphatase: 58 U/L (ref 38–126)
Anion gap: 11 (ref 5–15)
BUN: 25 mg/dL — ABNORMAL HIGH (ref 6–20)
CO2: 25 mmol/L (ref 22–32)
Calcium: 9.8 mg/dL (ref 8.9–10.3)
Chloride: 97 mmol/L — ABNORMAL LOW (ref 98–111)
Creatinine, Ser: 0.79 mg/dL (ref 0.44–1.00)
GFR, Estimated: >60 mL/min (ref >60)
Glucose, Bld: 138 mg/dL — ABNORMAL HIGH (ref 70–99)
Potassium: 4.4 mmol/L (ref 3.5–5.1)
Sodium: 133 mmol/L — ABNORMAL LOW (ref 135–145)
Total Bilirubin: 0.5 mg/dL (ref 0.3–1.2)
Total Protein: 8.5 g/dL — ABNORMAL HIGH (ref 6.5–8.1)

## 2023-04-10 LAB — LACTIC ACID, PLASMA: Lactic Acid, Venous: 1.1 mmol/L (ref 0.5–1.9)

## 2023-04-10 LAB — LIPASE, BLOOD: Lipase: 19 U/L (ref 11–51)

## 2023-04-10 MED ORDER — ONDANSETRON HCL 4 MG/2ML IJ SOLN
4.0000 mg | Freq: Once | INTRAMUSCULAR | Status: AC
  Administered 2023-04-10: 4 mg via INTRAVENOUS
  Filled 2023-04-10: qty 2

## 2023-04-10 MED ORDER — DROPERIDOL 2.5 MG/ML IJ SOLN
2.5000 mg | Freq: Once | INTRAMUSCULAR | Status: DC
Start: 2023-04-10 — End: 2023-04-10

## 2023-04-10 MED ORDER — VANCOMYCIN HCL IN DEXTROSE 1-5 GM/200ML-% IV SOLN
1000.0000 mg | Freq: Once | INTRAVENOUS | Status: AC
  Administered 2023-04-10: 1000 mg via INTRAVENOUS
  Filled 2023-04-10: qty 200

## 2023-04-10 MED ORDER — SODIUM CHLORIDE 0.9 % IV SOLN
2.0000 g | Freq: Once | INTRAVENOUS | Status: AC
  Administered 2023-04-10: 2 g via INTRAVENOUS
  Filled 2023-04-10: qty 12.5

## 2023-04-10 MED ORDER — BUTAMBEN-TETRACAINE-BENZOCAINE 2-2-14 % EX AERO
1.0000 | INHALATION_SPRAY | Freq: Once | CUTANEOUS | Status: AC
  Administered 2023-04-10: 1 via TOPICAL
  Filled 2023-04-10: qty 5

## 2023-04-10 MED ORDER — LACTATED RINGERS IV BOLUS
1000.0000 mL | Freq: Once | INTRAVENOUS | Status: AC
  Administered 2023-04-10: 1000 mL via INTRAVENOUS

## 2023-04-10 MED ORDER — HYDROMORPHONE HCL 1 MG/ML IJ SOLN
1.0000 mg | Freq: Once | INTRAMUSCULAR | Status: AC
  Administered 2023-04-10: 1 mg via INTRAVENOUS
  Filled 2023-04-10: qty 1

## 2023-04-10 MED ORDER — IOHEXOL 300 MG/ML  SOLN
100.0000 mL | Freq: Once | INTRAMUSCULAR | Status: AC | PRN
  Administered 2023-04-10: 100 mL via INTRAVENOUS

## 2023-04-10 MED ORDER — SODIUM CHLORIDE 0.9 % IV BOLUS
1000.0000 mL | Freq: Once | INTRAVENOUS | Status: AC
  Administered 2023-04-10: 1000 mL via INTRAVENOUS

## 2023-04-10 NOTE — Progress Notes (Signed)
PHARMACY -  BRIEF ANTIBIOTIC NOTE   Pharmacy has received consult(s) for Cefepime and Vancomycin from an ED provider.  The patient's profile has been reviewed for ht/wt/allergies/indication/available labs.    One time order(s) placed for Cefepime 2g and Vancomycin 1g  Further antibiotics/pharmacy consults should be ordered by admitting physician if indicated.                       Thank you, Foye Deer 04/10/2023  9:26 PM

## 2023-04-10 NOTE — ED Provider Notes (Signed)
[facility] Provider Note    Event Date/Time   First MD Initiated Contact with Patient 04/10/23 2044     (approximate)   History   Abdominal Pain   HPI  Regina Mays is a 37 y.o. female here with multiple complaints.  The patient's primary complaint is abdominal pain, which she describes as diffuse but primarily epigastric and right upper quadrant.  She has had associated nausea and vomiting.  She has had difficulty keeping anything down.  She states that she has also had significantly worsening upper dental pain along her central and lateral incisor.  She has markedly poor dentition at baseline and is currently being worked up to have significant dental surgery at [facility].  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  She also has a history of recurrent maxillary sinusitis.  She states her sinuses feel normal but her dental pain is significant worse than usual.     Physical Exam   Triage Vital Signs: ED Triage Vitals  Encounter Vitals Group     BP 04/10/23 1847 110/69     Systolic BP Percentile --      Diastolic BP Percentile --      Pulse Rate 04/10/23 1847 85     Resp 04/10/23 1847 18     Temp 04/10/23 1847 98.2 F (36.8 C)     Temp src --      SpO2 04/10/23 1847 100 %     Weight --      Height --      Head Circumference --      Peak Flow --      Pain Score 04/10/23 1845 10     Pain Loc --      Pain Education --      Exclude from Growth Chart --     Most recent vital signs: Vitals:   04/11/23 0100 04/11/23 0135  BP:  132/76  Pulse:  (!) 59  Resp: 20 (!) 22  Temp:  98.7 F (37.1 C)  SpO2:  97%     General: Awake, no distress.  CV:  Good peripheral perfusion.  Regular rate and rhythm.  No murmurs. Resp:  Normal work of breathing.  Lungs clear to auscultation bilaterally. Abd:  No distention.  No tenderness. Other:  Significant chronic inflammation of particular the left nasopharynx and she is post  resection of hard palate with chronic eschar noted.  No maxillary swelling or tenderness.  She has significant poor dentition with tenderness to palpation over the central lateral incisors which have been degraded by caries.   ED Results / Procedures / Treatments   Labs (all labs ordered are listed, but only abnormal results are displayed) Labs Reviewed  COMPREHENSIVE METABOLIC PANEL - Abnormal; Notable for the following components:      Result Value   Sodium 133 (*)    Chloride 97 (*)    Glucose, Bld 138 (*)    BUN 25 (*)    Total Protein 8.5 (*)    AST 8 (*)    All other components within normal limits  CBC - Abnormal; Notable for the following components:   WBC 25.1 (*)    Hemoglobin 11.9 (*)    RDW 15.6 (*)    Platelets 588 (*)    All other components within normal limits  LIPASE, BLOOD  LACTIC ACID, PLASMA  URINALYSIS, ROUTINE W REFLEX MICROSCOPIC  HIV ANTIBODY (ROUTINE TESTING W REFLEX)  PROTIME-INR  CORTISOL-AM, BLOOD  PROCALCITONIN  BASIC METABOLIC PANEL  CBC     EKG    RADIOLOGY Ultrasound right upper quadrant: Negative CT abdomen/pelvis: Negative CT face: Likely periapical lucencies suggestive of dental infection, maxillary and sphenoid sinus disease   I also independently reviewed and agree with radiologist interpretations.   PROCEDURES:  Critical Care performed: No   MEDICATIONS ORDERED IN ED: Medications  lactated ringers infusion (has no administration in time range)  enoxaparin (LOVENOX) injection 40 mg (has no administration in time range)  ceFEPIme (MAXIPIME) 2 g in sodium chloride 0.9 % 100 mL IVPB (has no administration in time range)  metroNIDAZOLE (FLAGYL) IVPB 500 mg (has no administration in time range)  vancomycin (VANCOCIN) IVPB 1000 mg/200 mL premix (has no administration in time range)  acetaminophen (TYLENOL) tablet 650 mg (has no administration in time range)    Or  acetaminophen (TYLENOL) suppository 650 mg (has no  administration in time range)  traZODone (DESYREL) tablet 25 mg (has no administration in time range)  magnesium hydroxide (MILK OF MAGNESIA) suspension 30 mL (has no administration in time range)  ondansetron (ZOFRAN) tablet 4 mg (has no administration in time range)    Or  ondansetron (ZOFRAN) injection 4 mg (has no administration in time range)  lactated ringers bolus 1,000 mL (0 mLs Intravenous Stopped 04/10/23 2240)  sodium chloride 0.9 % bolus 1,000 mL (0 mLs Intravenous Stopped 04/11/23 0031)  HYDROmorphone (DILAUDID) injection 1 mg (1 mg Intravenous Given 04/10/23 2126)  ondansetron (ZOFRAN) injection 4 mg (4 mg Intravenous Given 04/10/23 2125)  butamben-tetracaine-benzocaine (CETACAINE) spray 1 spray (1 spray Topical Given 04/10/23 2130)  ceFEPIme (MAXIPIME) 2 g in sodium chloride 0.9 % 100 mL IVPB (0 g Intravenous Stopped 04/10/23 2254)  vancomycin (VANCOCIN) IVPB 1000 mg/200 mL premix (0 mg Intravenous Stopped 04/11/23 0031)  iohexol (OMNIPAQUE) 300 MG/ML solution 100 mL (100 mLs Intravenous Contrast Given 04/10/23 2222)  HYDROmorphone (DILAUDID) injection 1 mg (1 mg Intravenous Given 04/10/23 2335)  metroNIDAZOLE (FLAGYL) IVPB 500 mg (500 mg Intravenous New Bag/Given 04/11/23 0035)     IMPRESSION / MDM / ASSESSMENT AND PLAN / ED COURSE  I reviewed the triage vital signs and the nursing notes.                              Differential diagnosis includes, but is not limited to, dental infection, sinusitis, sepsis, cholecystitis, biliary colic, UTI, neprholithiasis  Patient's presentation is most consistent with acute presentation with potential threat to life or bodily function.  The patient is on the cardiac monitor to evaluate for evidence of arrhythmia and/or significant heart rate changes  37 yo F with complicated PMHx including polyangiitis on Rituxan here with multiple complaints. Re: dental and facial pain, suspect acute dental infection with possible concomitant sinusitis. Pt has long h/o  same. No signs of abscess on CT imaging, which does show active periapical lucencies and sinusitis. No signs of invasive infection. Will cover broadly given her immunosuppression. Re: her abd pain, n/v - this could be related to her infection/pain. CT A/P obtained, reviewed, and is unremarkable. Labs show normal LFTs, lipase. LA normal. CBC unremarkable.  Given her degree of pain, active dental infection with n/v, and immunosuppression, will admit for IVF, ABX, and further management.   FINAL CLINICAL IMPRESSION(S) / ED DIAGNOSES   Final diagnoses:  Immunosuppression (HCC)  Dental infection  Right upper quadrant abdominal pain  Nausea and vomiting, unspecified vomiting  type     Rx / DC Orders   ED Discharge Orders     None        Note:  This document was prepared using Dragon voice recognition software and may include unintentional dictation errors.   Shaune Pollack, MD 04/11/23 [phone]

## 2023-04-10 NOTE — ED Triage Notes (Signed)
Pt comes with c/o belly pain and vomiting. Pt states some right sided pain that radiates to middle. Pt states this all started two days ago. Pt states infection in her mouth also and she can taste it.

## 2023-04-11 DIAGNOSIS — Z880 Allergy status to penicillin: Secondary | ICD-10-CM | POA: Diagnosis not present

## 2023-04-11 DIAGNOSIS — F17210 Nicotine dependence, cigarettes, uncomplicated: Secondary | ICD-10-CM | POA: Diagnosis present

## 2023-04-11 DIAGNOSIS — F419 Anxiety disorder, unspecified: Secondary | ICD-10-CM | POA: Diagnosis present

## 2023-04-11 DIAGNOSIS — F32A Depression, unspecified: Secondary | ICD-10-CM

## 2023-04-11 DIAGNOSIS — K029 Dental caries, unspecified: Secondary | ICD-10-CM | POA: Diagnosis present

## 2023-04-11 DIAGNOSIS — Z833 Family history of diabetes mellitus: Secondary | ICD-10-CM | POA: Diagnosis not present

## 2023-04-11 DIAGNOSIS — J320 Chronic maxillary sinusitis: Secondary | ICD-10-CM | POA: Diagnosis present

## 2023-04-11 DIAGNOSIS — G894 Chronic pain syndrome: Secondary | ICD-10-CM | POA: Diagnosis present

## 2023-04-11 DIAGNOSIS — Z885 Allergy status to narcotic agent status: Secondary | ICD-10-CM | POA: Diagnosis not present

## 2023-04-11 DIAGNOSIS — Z888 Allergy status to other drugs, medicaments and biological substances status: Secondary | ICD-10-CM | POA: Diagnosis not present

## 2023-04-11 DIAGNOSIS — A419 Sepsis, unspecified organism: Secondary | ICD-10-CM

## 2023-04-11 DIAGNOSIS — Z8249 Family history of ischemic heart disease and other diseases of the circulatory system: Secondary | ICD-10-CM | POA: Diagnosis not present

## 2023-04-11 DIAGNOSIS — J019 Acute sinusitis, unspecified: Secondary | ICD-10-CM | POA: Diagnosis present

## 2023-04-11 DIAGNOSIS — J0100 Acute maxillary sinusitis, unspecified: Secondary | ICD-10-CM

## 2023-04-11 DIAGNOSIS — Z79899 Other long term (current) drug therapy: Secondary | ICD-10-CM | POA: Diagnosis not present

## 2023-04-11 DIAGNOSIS — K047 Periapical abscess without sinus: Secondary | ICD-10-CM | POA: Insufficient documentation

## 2023-04-11 DIAGNOSIS — R1013 Epigastric pain: Secondary | ICD-10-CM | POA: Diagnosis not present

## 2023-04-11 DIAGNOSIS — T402X5A Adverse effect of other opioids, initial encounter: Secondary | ICD-10-CM | POA: Diagnosis not present

## 2023-04-11 DIAGNOSIS — D849 Immunodeficiency, unspecified: Secondary | ICD-10-CM | POA: Diagnosis present

## 2023-04-11 DIAGNOSIS — R1011 Right upper quadrant pain: Secondary | ICD-10-CM | POA: Diagnosis not present

## 2023-04-11 HISTORY — DX: Acute sinusitis, unspecified: J01.90

## 2023-04-11 HISTORY — DX: Sepsis, unspecified organism: A41.9

## 2023-04-11 LAB — CORTISOL-AM, BLOOD: Cortisol - AM: 1.7 ug/dL — ABNORMAL LOW (ref 6.7–22.6)

## 2023-04-11 LAB — HIV ANTIBODY (ROUTINE TESTING W REFLEX): HIV Screen 4th Generation wRfx: NONREACTIVE

## 2023-04-11 LAB — CBC
HCT: 30.1 % — ABNORMAL LOW (ref 36.0–46.0)
Hemoglobin: 10 g/dL — ABNORMAL LOW (ref 12.0–15.0)
MCH: 28.2 pg (ref 26.0–34.0)
MCHC: 33.2 g/dL (ref 30.0–36.0)
MCV: 84.8 fL (ref 80.0–100.0)
Platelets: 418 10*3/uL — ABNORMAL HIGH (ref 150–400)
RBC: 3.55 MIL/uL — ABNORMAL LOW (ref 3.87–5.11)
RDW: 15.7 % — ABNORMAL HIGH (ref 11.5–15.5)
WBC: 21.5 10*3/uL — ABNORMAL HIGH (ref 4.0–10.5)
nRBC: 0 % (ref 0.0–0.2)

## 2023-04-11 LAB — BASIC METABOLIC PANEL
Anion gap: 6 (ref 5–15)
BUN: 19 mg/dL (ref 6–20)
CO2: 24 mmol/L (ref 22–32)
Calcium: 8.4 mg/dL — ABNORMAL LOW (ref 8.9–10.3)
Chloride: 104 mmol/L (ref 98–111)
Creatinine, Ser: 0.58 mg/dL (ref 0.44–1.00)
GFR, Estimated: >60 mL/min (ref >60)
Glucose, Bld: 93 mg/dL (ref 70–99)
Potassium: 3.8 mmol/L (ref 3.5–5.1)
Sodium: 134 mmol/L — ABNORMAL LOW (ref 135–145)

## 2023-04-11 LAB — PROCALCITONIN: Procalcitonin: <0.1 ng/mL

## 2023-04-11 LAB — PROTIME-INR
INR: 1.1 (ref 0.8–1.2)
Prothrombin Time: 13.9 s (ref 11.4–15.2)

## 2023-04-11 MED ORDER — QUETIAPINE FUMARATE ER 300 MG PO TB24
300.0000 mg | ORAL_TABLET | Freq: Every day | ORAL | Status: DC
  Administered 2023-04-11 – 2023-04-12 (×2): 300 mg via ORAL
  Filled 2023-04-11 (×2): qty 1

## 2023-04-11 MED ORDER — HYDROCODONE-ACETAMINOPHEN 5-325 MG PO TABS
1.0000 | ORAL_TABLET | ORAL | Status: DC | PRN
  Administered 2023-04-11 – 2023-04-12 (×2): 1 via ORAL
  Filled 2023-04-11 (×2): qty 1

## 2023-04-11 MED ORDER — METRONIDAZOLE 500 MG/100ML IV SOLN
500.0000 mg | Freq: Two times a day (BID) | INTRAVENOUS | Status: DC
  Administered 2023-04-11: 500 mg via INTRAVENOUS
  Filled 2023-04-11: qty 100

## 2023-04-11 MED ORDER — VANCOMYCIN HCL 1500 MG/300ML IV SOLN
1500.0000 mg | INTRAVENOUS | Status: DC
  Administered 2023-04-11: 1500 mg via INTRAVENOUS
  Filled 2023-04-11: qty 300

## 2023-04-11 MED ORDER — ENOXAPARIN SODIUM 60 MG/0.6ML IJ SOSY
45.0000 mg | PREFILLED_SYRINGE | INTRAMUSCULAR | Status: DC
  Administered 2023-04-11 – 2023-04-13 (×3): 45 mg via SUBCUTANEOUS
  Filled 2023-04-11 (×3): qty 0.6

## 2023-04-11 MED ORDER — FAMOTIDINE 20 MG PO TABS
20.0000 mg | ORAL_TABLET | Freq: Two times a day (BID) | ORAL | Status: DC
  Administered 2023-04-11 – 2023-04-13 (×5): 20 mg via ORAL
  Filled 2023-04-11 (×5): qty 1

## 2023-04-11 MED ORDER — SODIUM CHLORIDE 0.9 % IV SOLN
2.0000 g | Freq: Three times a day (TID) | INTRAVENOUS | Status: DC
  Administered 2023-04-11 – 2023-04-13 (×7): 2 g via INTRAVENOUS
  Filled 2023-04-11 (×8): qty 12.5

## 2023-04-11 MED ORDER — ONDANSETRON HCL 4 MG/2ML IJ SOLN
4.0000 mg | Freq: Four times a day (QID) | INTRAMUSCULAR | Status: DC | PRN
  Administered 2023-04-11: 4 mg via INTRAVENOUS
  Filled 2023-04-11: qty 2

## 2023-04-11 MED ORDER — BUPRENORPHINE HCL-NALOXONE HCL 8-2 MG SL SUBL
1.0000 | SUBLINGUAL_TABLET | Freq: Three times a day (TID) | SUBLINGUAL | Status: DC
  Administered 2023-04-11 – 2023-04-13 (×6): 1 via SUBLINGUAL
  Filled 2023-04-11 (×6): qty 1

## 2023-04-11 MED ORDER — IBUPROFEN 400 MG PO TABS
400.0000 mg | ORAL_TABLET | Freq: Three times a day (TID) | ORAL | Status: DC | PRN
  Administered 2023-04-11 – 2023-04-13 (×3): 400 mg via ORAL
  Filled 2023-04-11 (×3): qty 1

## 2023-04-11 MED ORDER — SODIUM CHLORIDE 0.9 % IV SOLN: INTRAVENOUS | Status: AC

## 2023-04-11 MED ORDER — ACETAMINOPHEN 650 MG RE SUPP: 650.0000 mg | Freq: Four times a day (QID) | RECTAL | Status: DC | PRN

## 2023-04-11 MED ORDER — MAGNESIUM HYDROXIDE 400 MG/5ML PO SUSP: 30.0000 mL | Freq: Every day | ORAL | Status: DC | PRN

## 2023-04-11 MED ORDER — ONDANSETRON HCL 4 MG PO TABS: 4.0000 mg | ORAL_TABLET | Freq: Four times a day (QID) | ORAL | Status: DC | PRN

## 2023-04-11 MED ORDER — QUETIAPINE FUMARATE 25 MG PO TABS: 100.0000 mg | ORAL_TABLET | Freq: Every day | ORAL | Status: DC

## 2023-04-11 MED ORDER — ACETAMINOPHEN 325 MG PO TABS
650.0000 mg | ORAL_TABLET | Freq: Four times a day (QID) | ORAL | Status: DC | PRN
  Administered 2023-04-11 (×2): 650 mg via ORAL
  Filled 2023-04-11 (×2): qty 2

## 2023-04-11 MED ORDER — METRONIDAZOLE 500 MG/100ML IV SOLN
500.0000 mg | Freq: Once | INTRAVENOUS | Status: AC
  Administered 2023-04-11: 500 mg via INTRAVENOUS
  Filled 2023-04-11: qty 100

## 2023-04-11 MED ORDER — SODIUM CHLORIDE 0.9 % IV SOLN: 2.0000 g | Freq: Once | INTRAVENOUS | Status: DC

## 2023-04-11 MED ORDER — VANCOMYCIN HCL IN DEXTROSE 1-5 GM/200ML-% IV SOLN: 1000.0000 mg | Freq: Once | INTRAVENOUS | Status: DC

## 2023-04-11 MED ORDER — LACTATED RINGERS IV SOLN
150.0000 mL/h | INTRAVENOUS | Status: DC
  Administered 2023-04-11: 150 mL/h via INTRAVENOUS

## 2023-04-11 MED ORDER — TRAZODONE HCL 50 MG PO TABS: 25.0000 mg | ORAL_TABLET | Freq: Every evening | ORAL | Status: DC | PRN

## 2023-04-11 NOTE — Progress Notes (Addendum)
Pharmacy Antibiotic Note  Regina Mays is a 37 y.o. female admitted on 04/10/2023 with infection of unknown source.  Pharmacy has been consulted for Cefepime & Vancomycin dosing for 7 days.  Plan: Cefepime 2 gm q8hr per indication & renal fxn.  Pt given Vancomycin 1000 mg once. Vancomycin 1500 mg IV Q 24 hrs. Goal AUC 400-550. Expected AUC: 520.2 SCr used: 0.79, Vd used: 0.5, BMI: 37  Pharmacy will continue to follow and will adjust abx dosing whenever warranted.  Temp (24hrs), Avg:98.5 F (36.9 C), Min:98.2 F (36.8 C), Max:98.7 F (37.1 C)   Recent Labs  Lab 04/10/23 1849 04/10/23 2125  WBC 25.1*  --   CREATININE 0.79  --   LATICACIDVEN  --  1.1    CrCl cannot be calculated (Unknown ideal weight.).    Allergies  Allergen Reactions   Penicillins Diarrhea and Nausea And Vomiting    Has patient had a PCN reaction causing immediate rash, facial/tongue/throat swelling, SOB or lightheadedness with hypotension: yes Has patient had a PCN reaction causing severe rash involving mucus membranes or skin necrosis: no Has patient had a PCN reaction that required hospitalization: no Has patient had a PCN reaction occurring within the last 10 years: yes If all of the above answers are "NO", then may proceed with Cephalosporin use.     SWELLING OF MOUTH/THROAT AND  Diarrhea, n & v    Hydrocodone Hives and Rash   Toradol [Ketorolac Tromethamine] Rash   Tramadol Hives and Rash    Antimicrobials this admission: 9/06 Cefepime >> x 7 days 9/07 Flagyl >> x 7 days 9/06 Vancomycin >> x 7 days  Microbiology results: No lab cx currently ordered or pending at this time.  Thank you for allowing pharmacy to be a part of this patient's care.  Otelia Sergeant, PharmD, MBA 04/11/2023 2:15 AM

## 2023-04-11 NOTE — Assessment & Plan Note (Signed)
-   This should respond to above-mentioned antibiotics. - Pain management will be provided.

## 2023-04-11 NOTE — H&P (Signed)
[place]   PATIENT NAME: Regina Mays    MR#:  [id number]  DATE OF BIRTH:  25-Jan-1986  DATE OF ADMISSION:  04/10/2023  PRIMARY CARE PHYSICIAN: Miki Kins, FNP   Patient is coming from: Home  REQUESTING/REFERRING PHYSICIAN: Shaune Pollack, MD  CHIEF COMPLAINT:   Chief Complaint  Patient presents with   Abdominal Pain    HISTORY OF PRESENT ILLNESS:  Regina FETTING is a 37 y.o. Caucasian female with medical history significant for anxiety, migraine and panic attacks, who presented to the emergency room with acute onset of right upper quadrant abdominal pain with associated severe nasal and sinus congestion with drainage of greenish sputum as well as left upper jaw dental pain.  No fever or chills.  No nausea or vomiting or abdominal pain.  No sore throat or earache.  No dysuria, oliguria or hematuria or flank pain.  ED Course: Upon admission to the emergency room, vital signs were within normal.  Later on respiratory rate was 22.  Labs revealed significant cytosis of 25.1 and thrombocytosis likely reactive.  Lactic acid was 1.1.  CMP was remarkable for a BUN of 25 and sodium 133. EKG as reviewed by me : None Imaging: Right apical ultrasound was normal.  Abdominal and pelvic CT scan with contrast revealed no acute abnormalities..  The patient was given IV vancomycin, cefepime and Dilaudid 2 mg IV, Percocet a cane, 1 L bolus of IV lactated Ringer  and 1 L bolus of IV normal saline, 4 mg of IV Zofran.  She will be admitted to a medical bed for further evaluation and management. PAST MEDICAL HISTORY:   Past Medical History:  Diagnosis Date   Anxiety    NO MEDS   Closed left ankle fracture    original injury 2014--- reinjured 09/ 2016   Complication of anesthesia    "fear not waking up"-PT STATES SHE WAS VERY AGITATED WHEN SHE WOKE UP AFTER WISDONM TEETH   Headache    MIGRAINES   Hx of opioid abuse (HCC)    Opiod/cocaine.  In rehab   Left ankle pain    Left  ankle swelling    Panic attacks    Wears dentures    partial upper    PAST SURGICAL HISTORY:   Past Surgical History:  Procedure Laterality Date   CYSTOSCOPY N/A 07/30/2017   Procedure: CYSTOSCOPY;  Surgeon: Nadara Mustard, MD;  Location: [facility];  Service: Gynecology;  Laterality: N/A;   LAPAROSCOPIC HYSTERECTOMY Bilateral 07/30/2017   Procedure: HYSTERECTOMY TOTAL LAPAROSCOPIC BILATERAL SALPINGECTOMY;  Surgeon: Nadara Mustard, MD;  Location: [facility];  Service: Gynecology;  Laterality: Bilateral;   LEG SURGERY Left 2016   ankle was broken. 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;  Surgeon: Linus Salmons, MD;  Location: [facility];  Service: ENT;  Laterality: N/A;   TUBAL LIGATION  2007   WISDOM TOOTH EXTRACTION      SOCIAL HISTORY:   Social History   Tobacco Use   Smoking status: Every Day    Current packs/day: 0.50    Average packs/day: 0.5 packs/day for 19.0 years (9.5 ttl pk-yrs)    Types: Cigarettes   Smokeless tobacco: Never   Tobacco comments:    Started smoking age 25  Substance Use Topics   Alcohol use: No    FAMILY HISTORY:   Family History  Problem  Relation Age of Onset   Hypertension Mother    Cancer Mother    Hypertension Father    Diabetes Maternal Grandmother     DRUG ALLERGIES:   Allergies  Allergen Reactions   Penicillins Diarrhea and Nausea And Vomiting    Has patient had a PCN reaction causing immediate rash, facial/tongue/throat swelling, SOB or lightheadedness with hypotension: yes Has patient had a PCN reaction causing severe rash involving mucus membranes or skin necrosis: no Has patient had a PCN reaction that required hospitalization: no Has patient had a PCN reaction occurring within the last 10 years: yes If all of the above answers are "NO", then may proceed with Cephalosporin use.     SWELLING OF MOUTH/THROAT AND   Diarrhea, n & v    Hydrocodone Hives and Rash   Toradol [Ketorolac Tromethamine] Rash   Tramadol Hives and Rash    REVIEW OF SYSTEMS:   ROS As per history of present illness. All pertinent systems were reviewed above. Constitutional, HEENT, cardiovascular, respiratory, GI, GU, musculoskeletal, neuro, psychiatric, endocrine, integumentary and hematologic systems were reviewed and are otherwise negative/unremarkable except for positive findings mentioned above in the HPI.   MEDICATIONS AT HOME:   Prior to Admission medications   Medication Sig Start Date End Date Taking? Authorizing Provider  ciprofloxacin (CIPRO) 500 MG tablet Take 500 mg by mouth 2 (two) times daily. 03/25/23  Yes [provider]  QUEtiapine (SEROQUEL) 100 MG tablet Take 100 mg by mouth at bedtime. 08/11/18  Yes [provider]  SUBOXONE 8-2 MG FILM Place 1 Film under the tongue 3 (three) times daily. 08/11/18  Yes [provider]  busPIRone (BUSPAR) 10 MG tablet Take 10 mg by mouth at bedtime. Patient not taking: Reported on 04/11/2023    [provider]  clonazePAM (KLONOPIN) 0.5 MG tablet Take 0.5 mg by mouth 3 (three) times daily. Patient not taking: Reported on 04/11/2023 03/31/23   [provider]  gentamicin cream (GARAMYCIN) 0.1 % Apply 1 Application topically 3 (three) times daily. Patient not taking: Reported on 04/11/2023    [provider]      VITAL SIGNS:  Blood pressure (!) 151/94, pulse 60, temperature 98.7 F (37.1 C), temperature source Oral, resp. rate 20, height 5\' 1"  (1.549 m), weight 88.9 kg, last menstrual period 06/04/2017, SpO2 100%.  PHYSICAL EXAMINATION:  Physical Exam  GENERAL:  37 y.o.-year-old Caucasian female patient lying in the bed with no acute distress.  EYES: Pupils equal, round, reactive to light and accommodation. No scleral icterus. Extraocular muscles intact.  HEENT: Head atraumatic, normocephalic. Oropharynx with moist mucous  membrane and left upper jaw dental tenderness and nasopharynx with swollen and tender bilateral maxillary sinuses and hypertrophied turbinates NECK:  Supple, no jugular venous distention. No thyroid enlargement, no tenderness.  LUNGS: Normal breath sounds bilaterally, no wheezing, rales,rhonchi or crepitation. No use of accessory muscles of respiration.  CARDIOVASCULAR: Regular rate and rhythm, S1, S2 normal. No murmurs, rubs, or gallops.  ABDOMEN: Soft, nondistended, with mild right upper quadrant tenderness without rebound tenderness guarding or rigidity. Bowel sounds present. No organomegaly or mass.  EXTREMITIES: No pedal edema, cyanosis, or clubbing.  NEUROLOGIC: Cranial nerves II through XII are intact. Muscle strength 5/5 in all extremities. Sensation intact. Gait not checked.  PSYCHIATRIC: The patient is alert and oriented x 3.  Normal affect and good eye contact. SKIN: No obvious rash, lesion, or ulcer.   LABORATORY PANEL:   CBC Recent Labs  Lab 04/10/23 1849  WBC 25.1*  HGB 11.9*  HCT 36.8  PLT 588*   ------------------------------------------------------------------------------------------------------------------  Chemistries  Recent Labs  Lab 04/10/23 1849  NA 133*  K 4.4  CL 97*  CO2 25  GLUCOSE 138*  BUN 25*  CREATININE 0.79  CALCIUM 9.8  AST 8*  ALT 18  ALKPHOS 58  BILITOT 0.5   ------------------------------------------------------------------------------------------------------------------  Cardiac Enzymes No results for input(s): "TROPONINI" in the last 168 hours. ------------------------------------------------------------------------------------------------------------------  RADIOLOGY:  CT Maxillofacial Wo Contrast  Result Date: 04/10/2023 CLINICAL DATA:  Facial pain. EXAM: CT MAXILLOFACIAL WITHOUT CONTRAST TECHNIQUE: Multidetector CT imaging of the maxillofacial structures was performed. Multiplanar CT image reconstructions were also generated.  RADIATION DOSE REDUCTION: This exam was performed according to the departmental dose-optimization program which includes automated exposure control, adjustment of the mA and/or kV according to patient size and/or use of iterative reconstruction technique. COMPARISON:  None Available. FINDINGS: Osseous: There is no acute fracture or dislocation identified. No focal osseous lesion. There are periapical lucencies surrounding the right maxillary lateral incisor as well as bilateral maxillary molar teeth. Orbits: Negative. No traumatic or inflammatory finding. Sinuses: There is mucosal thickening of the right maxillary and sphenoid sinuses. There is an air-fluid level in the right maxillary sinus. There is a polyp or mucous retention cyst in the left maxillary sinus measuring 2.7 x 1.5 by 1.7 cm. The posterior aspect of the nasal septum appears absent which may be related to prior surgery. Soft tissues: Negative. Limited intracranial: No significant or unexpected finding. IMPRESSION: 1. No acute fracture or dislocation of the facial bones. 2. Right maxillary and sphenoid sinus disease. Air-fluid level in the right maxillary sinus may represent acute sinusitis in the appropriate clinical setting. 3. Left maxillary sinus polyp or mucous retention cyst. 4. Periapical lucencies surrounding the right maxillary lateral incisor as well as bilateral maxillary molar teeth. Correlate with dental exam. 5. Posterior aspect of the nasal septum appears absent which may be related to prior surgery. Correlate with surgical history. Electronically Signed   By: Darliss Cheney M.D.   On: 04/10/2023 23:57   CT ABDOMEN PELVIS W CONTRAST  Result Date: 04/10/2023 CLINICAL DATA:  Acute nonlocalized abdominal pain, vomiting, right-sided abdominal pain. EXAM: CT ABDOMEN AND PELVIS WITH CONTRAST TECHNIQUE: Multidetector CT imaging of the abdomen and pelvis was performed using the standard protocol following bolus administration of intravenous  contrast. RADIATION DOSE REDUCTION: This exam was performed according to the departmental dose-optimization program which includes automated exposure control, adjustment of the mA and/or kV according to patient size and/or use of iterative reconstruction technique. CONTRAST:  OMNIPAQUE IOHEXOL 300 MG/ML  SOLN COMPARISON:  08/17/2014 FINDINGS: Lower chest: No acute abnormality. Hepatobiliary: No focal liver abnormality is seen. No gallstones, gallbladder wall thickening, or biliary dilatation. Pancreas: Unremarkable Spleen: Unremarkable Adrenals/Urinary Tract: Adrenal glands are unremarkable. Kidneys are normal, without renal calculi, focal lesion, or hydronephrosis. Bladder is unremarkable. Stomach/Bowel: Stomach is within normal limits. Appendix absent. No evidence of bowel wall thickening, distention, or inflammatory changes. Vascular/Lymphatic: No significant vascular findings are present. No enlarged abdominal or pelvic lymph nodes. Reproductive: Status post hysterectomy. No adnexal masses. Other: None significant Musculoskeletal: No acute or significant osseous findings. IMPRESSION: 1. No acute intra-abdominal pathology identified. No definite radiographic explanation for the patient's reported symptoms. Electronically Signed   By: Helyn Numbers M.D.   On: 04/10/2023 22:57   US Abdomen Limited RUQ (LIVER/GB)  Result Date: 04/10/2023 CLINICAL DATA:  Right upper quadrant pain EXAM: ULTRASOUND ABDOMEN LIMITED RIGHT UPPER  QUADRANT COMPARISON:  CT abdomen and pelvis 08/17/2014 FINDINGS: Gallbladder: No gallstones or wall thickening visualized. No sonographic Murphy sign noted by sonographer. Common bile duct: Diameter: 3.8 mm Liver: No focal lesion identified. Within normal limits in parenchymal echogenicity. Portal vein is patent on color Doppler imaging with normal direction of blood flow towards the liver. Other: None. IMPRESSION: Normal right upper quadrant ultrasound. Electronically Signed   By: Darliss Cheney M.D.   On: 04/10/2023 22:34      IMPRESSION AND PLAN:  Assessment and Plan: * Sepsis due to undetermined organism [facility]) - The patient will be admitted to a medical bed. - Will continue antibiotic therapy with IV vancomycin, cefepime and Flagyl. - We will continue hydration with IV lactated ringer. - This is likely secondary to severe acute sinusitis and possibly dental abscess.  Acute sinusitis - Management as above. - We will add Flonase nasal spray.  Dental abscess - This should respond to above-mentioned antibiotics. - Pain management will be provided.  Anxiety and depression - We will continue BuSpar, Klonopin, Wellbutrin XL and Seroquel.       DVT prophylaxis: Lovenox.  Advanced Care Planning:  Code Status: full code.  Family Communication:  The plan of care was discussed in details with the patient (and family). I answered all questions. The patient agreed to proceed with the above mentioned plan. Further management will depend upon hospital course. Disposition Plan: Back to previous home environment Consults called: none.  All the records are reviewed and case discussed with ED provider.  Status is: Inpatient    At the time of the admission, it appears that the appropriate admission status for this patient is inpatient.  This is judged to be reasonable and necessary in order to provide the required intensity of service to ensure the patient's safety given the presenting symptoms, physical exam findings and initial radiographic and laboratory data in the context of comorbid conditions.  The patient requires inpatient status due to high intensity of service, high risk of further deterioration and high frequency of surveillance required.  I certify that at the time of admission, it is my clinical judgment that the patient will require inpatient hospital care extending more than 2 midnights.                            Dispo: The patient is from: Home               Anticipated d/c is to: Home              Patient currently is not medically stable to d/c.              Difficult to place patient: No  Hannah Beat M.D on 04/11/2023 at 5:04 AM  [facility]   From 7 PM-7 AM, contact night-coverage [email or web address]  CC: Primary care physician; Miki Kins, FNP

## 2023-04-11 NOTE — Assessment & Plan Note (Signed)
-   Management as above. - We will add Flonase nasal spray.

## 2023-04-11 NOTE — Progress Notes (Addendum)
[facility]  - [place] at [facility]   PATIENT NAME: Regina Mays    MR#:  [id number]  DATE OF BIRTH:  September 03, 1985  SUBJECTIVE:  patient's family at bedside seen earlier. Complains of abdominal pain. Denies any nausea vomiting had bowel movement yesterday. Receive Dilaudid in the ER which patient thinks could be making her vomit.   VITALS:  Blood pressure 131/84, pulse 64, temperature 97.9 F (36.6 C), temperature source Oral, resp. rate 18, height 5\' 1"  (1.549 m), weight 88.9 kg, last menstrual period 06/04/2017, SpO2 100%.  PHYSICAL EXAMINATION:   GENERAL:  37 y.o.-year-old patient with no acute distress. Poor oral hygiene. Has dental caries LUNGS: Normal breath sounds bilaterally, no wheezing CARDIOVASCULAR: S1, S2 normal. No murmur   ABDOMEN: Soft, nontender, nondistended. Bowel sounds present.  EXTREMITIES: No  edema b/l.    NEUROLOGIC: nonfocal  patient is alert and awake SKIN: No obvious rash, lesion, or ulcer.   LABORATORY PANEL:  CBC Recent Labs  Lab 04/11/23 0617  WBC 21.5*  HGB 10.0*  HCT 30.1*  PLT 418*    Chemistries  Recent Labs  Lab 04/10/23 1849 04/11/23 0617  NA 133* 134*  K 4.4 3.8  CL 97* 104  CO2 25 24  GLUCOSE 138* 93  BUN 25* 19  CREATININE 0.79 0.58  CALCIUM 9.8 8.4*  AST 8*  --   ALT 18  --   ALKPHOS 58  --   BILITOT 0.5  --    Cardiac Enzymes No results for input(s): "TROPONINI" in the last 168 hours. RADIOLOGY:  CT Maxillofacial Wo Contrast  Result Date: 04/10/2023 CLINICAL DATA:  Facial pain. EXAM: CT MAXILLOFACIAL WITHOUT CONTRAST TECHNIQUE: Multidetector CT imaging of the maxillofacial structures was performed. Multiplanar CT image reconstructions were also generated. RADIATION DOSE REDUCTION: This exam was performed according to the departmental dose-optimization program which includes automated exposure control, adjustment of the mA and/or kV according to patient size and/or use of iterative reconstruction  technique. COMPARISON:  None Available. FINDINGS: Osseous: There is no acute fracture or dislocation identified. No focal osseous lesion. There are periapical lucencies surrounding the right maxillary lateral incisor as well as bilateral maxillary molar teeth. Orbits: Negative. No traumatic or inflammatory finding. Sinuses: There is mucosal thickening of the right maxillary and sphenoid sinuses. There is an air-fluid level in the right maxillary sinus. There is a polyp or mucous retention cyst in the left maxillary sinus measuring 2.7 x 1.5 by 1.7 cm. The posterior aspect of the nasal septum appears absent which may be related to prior surgery. Soft tissues: Negative. Limited intracranial: No significant or unexpected finding. IMPRESSION: 1. No acute fracture or dislocation of the facial bones. 2. Right maxillary and sphenoid sinus disease. Air-fluid level in the right maxillary sinus may represent acute sinusitis in the appropriate clinical setting. 3. Left maxillary sinus polyp or mucous retention cyst. 4. Periapical lucencies surrounding the right maxillary lateral incisor as well as bilateral maxillary molar teeth. Correlate with dental exam. 5. Posterior aspect of the nasal septum appears absent which may be related to prior surgery. Correlate with surgical history. Electronically Signed   By: Darliss Cheney M.D.   On: 04/10/2023 23:57   CT ABDOMEN PELVIS W CONTRAST  Result Date: 04/10/2023 CLINICAL DATA:  Acute nonlocalized abdominal pain, vomiting, right-sided abdominal pain. EXAM: CT ABDOMEN AND PELVIS WITH CONTRAST TECHNIQUE: Multidetector CT imaging of the abdomen and pelvis was performed using the standard protocol following bolus administration of intravenous contrast. RADIATION DOSE REDUCTION:  This exam was performed according to the departmental dose-optimization program which includes automated exposure control, adjustment of the mA and/or kV according to patient size and/or use of iterative  reconstruction technique. CONTRAST:  OMNIPAQUE IOHEXOL 300 MG/ML  SOLN COMPARISON:  08/17/2014 FINDINGS: Lower chest: No acute abnormality. Hepatobiliary: No focal liver abnormality is seen. No gallstones, gallbladder wall thickening, or biliary dilatation. Pancreas: Unremarkable Spleen: Unremarkable Adrenals/Urinary Tract: Adrenal glands are unremarkable. Kidneys are normal, without renal calculi, focal lesion, or hydronephrosis. Bladder is unremarkable. Stomach/Bowel: Stomach is within normal limits. Appendix absent. No evidence of bowel wall thickening, distention, or inflammatory changes. Vascular/Lymphatic: No significant vascular findings are present. No enlarged abdominal or pelvic lymph nodes. Reproductive: Status post hysterectomy. No adnexal masses. Other: None significant Musculoskeletal: No acute or significant osseous findings. IMPRESSION: 1. No acute intra-abdominal pathology identified. No definite radiographic explanation for the patient's reported symptoms. Electronically Signed   By: Helyn Numbers M.D.   On: 04/10/2023 22:57   US Abdomen Limited RUQ (LIVER/GB)  Result Date: 04/10/2023 CLINICAL DATA:  Right upper quadrant pain EXAM: ULTRASOUND ABDOMEN LIMITED RIGHT UPPER QUADRANT COMPARISON:  CT abdomen and pelvis 08/17/2014 FINDINGS: Gallbladder: No gallstones or wall thickening visualized. No sonographic Murphy sign noted by sonographer. Common bile duct: Diameter: 3.8 mm Liver: No focal lesion identified. Within normal limits in parenchymal echogenicity. Portal vein is patent on color Doppler imaging with normal direction of blood flow towards the liver. Other: None. IMPRESSION: Normal right upper quadrant ultrasound. Electronically Signed   By: Darliss Cheney M.D.   On: 04/10/2023 22:34    Assessment and Plan Regina Mays is a 37 y.o. Caucasian female with medical history significant for anxiety, migraine and panic attacks, who presented to the emergency room with acute onset of  right upper quadrant abdominal pain with associated severe nasal and sinus congestion with drainage of greenish sputum as well as left upper jaw dental pain.   Sepsis due to undetermined organism [facility]) acute maxillary sinusitis right as noted on CT imaging study -- patient received IV fluids, de-escalate antibiotic to IV cefepime -- will have ENT evaluated patient tomorrow -- continue PRN pain meds -- white count 25K--- 21K   Acute sinusitis - Management as above. - We will add Flonase nasal spray. -- Has seen ENT Dr. 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 Patient also has been referred to [facility] prosthodontics for issues with her dental issues  Abdominal pain with vomiting -- likely could be due to IV pain meds which have been discontinued -- CT abdomen pelvis and right upper quadrant ultrasound nothing acute -- trial of Pepcid AC -- denies constipation. Had BM yesterday   Anxiety and depression - continue  Seroquel.  Chronic pain syndrome -- patient follows with pain clinic in [place] -- will resume Suboxone -- confirm with patient she has allergy to hydrocodone and discussed will not be giving IV Dilaudid which likely could be causing her to have vomiting   Procedures: Family communication : family at bedside Consults : CODE STATUS: full DVT Prophylaxis : enoxaparin Level of care: Med-Surg Status is: Inpatient Remains inpatient appropriate because: acute sinusitis IV antibiotics    TOTAL TIME TAKING CARE OF THIS PATIENT: 35 minutes.  >50% time spent on counselling and coordination of care  Note: This dictation was prepared with Dragon dictation along with smaller phrase technology. Any transcriptional errors that result from this process are  unintentional.  Enedina Finner M.D    [facility]   CC: Primary care physician; Miki Kins,  FNP

## 2023-04-11 NOTE — Assessment & Plan Note (Signed)
-   We will continue BuSpar, Klonopin, Wellbutrin XL and Seroquel.

## 2023-04-11 NOTE — Assessment & Plan Note (Signed)
-   The patient will be admitted to a medical bed. - Will continue antibiotic therapy with IV vancomycin, cefepime and Flagyl. - We will continue hydration with IV lactated ringer. - This is likely secondary to severe acute sinusitis and possibly dental abscess.

## 2023-04-11 NOTE — Progress Notes (Signed)
PHARMACIST - PHYSICIAN COMMUNICATION  CONCERNING:  Enoxaparin (Lovenox) for DVT Prophylaxis    RECOMMENDATION: Patient was prescribed enoxaprin 40mg  q24 hours for VTE prophylaxis.   Filed Weights   04/11/23 0135  Weight: 88.9 kg (196 lb)    Body mass index is 37.03 kg/m.  Estimated Creatinine Clearance: 96.6 mL/min (by C-G formula based on SCr of 0.79 mg/dL).   Based on [facility] policy patient is candidate for enoxaparin 0.5mg /kg TBW SQ every 24 hours based on BMI being >30.  DESCRIPTION: Pharmacy has adjusted enoxaparin dose per [facility] policy.  Patient is now receiving enoxaparin 0.5 mg/kg every 24 hours   Otelia Sergeant, PharmD, [facility] 04/11/2023 2:43 AM

## 2023-04-12 ENCOUNTER — Encounter: Payer: Self-pay | Admitting: Family Medicine

## 2023-04-12 DIAGNOSIS — D849 Immunodeficiency, unspecified: Secondary | ICD-10-CM | POA: Diagnosis not present

## 2023-04-12 DIAGNOSIS — F419 Anxiety disorder, unspecified: Secondary | ICD-10-CM | POA: Diagnosis not present

## 2023-04-12 DIAGNOSIS — K047 Periapical abscess without sinus: Secondary | ICD-10-CM | POA: Diagnosis not present

## 2023-04-12 DIAGNOSIS — A419 Sepsis, unspecified organism: Secondary | ICD-10-CM | POA: Diagnosis not present

## 2023-04-12 LAB — URINALYSIS, ROUTINE W REFLEX MICROSCOPIC
Bilirubin Urine: NEGATIVE
Glucose, UA: NEGATIVE mg/dL
Ketones, ur: NEGATIVE mg/dL
Leukocytes,Ua: NEGATIVE
Nitrite: NEGATIVE
Protein, ur: NEGATIVE mg/dL
Specific Gravity, Urine: 1.013 (ref 1.005–1.030)
pH: 5 (ref 5.0–8.0)

## 2023-04-12 LAB — COMPREHENSIVE METABOLIC PANEL
ALT: 9 U/L (ref 0–44)
AST: 7 U/L — ABNORMAL LOW (ref 15–41)
Albumin: 3 g/dL — ABNORMAL LOW (ref 3.5–5.0)
Alkaline Phosphatase: 38 U/L (ref 38–126)
Anion gap: 7 (ref 5–15)
BUN: 13 mg/dL (ref 6–20)
CO2: 26 mmol/L (ref 22–32)
Calcium: 8.2 mg/dL — ABNORMAL LOW (ref 8.9–10.3)
Chloride: 105 mmol/L (ref 98–111)
Creatinine, Ser: 0.63 mg/dL (ref 0.44–1.00)
GFR, Estimated: >60 mL/min (ref >60)
Glucose, Bld: 90 mg/dL (ref 70–99)
Potassium: 3.9 mmol/L (ref 3.5–5.1)
Sodium: 138 mmol/L (ref 135–145)
Total Bilirubin: 0.4 mg/dL (ref 0.3–1.2)
Total Protein: 6 g/dL — ABNORMAL LOW (ref 6.5–8.1)

## 2023-04-12 LAB — CBC
HCT: 28.1 % — ABNORMAL LOW (ref 36.0–46.0)
Hemoglobin: 9.1 g/dL — ABNORMAL LOW (ref 12.0–15.0)
MCH: 28.2 pg (ref 26.0–34.0)
MCHC: 32.4 g/dL (ref 30.0–36.0)
MCV: 87 fL (ref 80.0–100.0)
Platelets: 269 10*3/uL (ref 150–400)
RBC: 3.23 MIL/uL — ABNORMAL LOW (ref 3.87–5.11)
RDW: 15.4 % (ref 11.5–15.5)
WBC: 6.4 10*3/uL (ref 4.0–10.5)
nRBC: 0 % (ref 0.0–0.2)

## 2023-04-12 LAB — GLUCOSE, CAPILLARY
Glucose-Capillary: 94 mg/dL (ref 70–99)
Glucose-Capillary: 103 mg/dL — ABNORMAL HIGH (ref 70–99)

## 2023-04-12 MED ORDER — SODIUM CHLORIDE 0.9 % IV SOLN: INTRAVENOUS | Status: DC | PRN

## 2023-04-12 NOTE — Progress Notes (Signed)
[facility]  - [place] at [facility]   PATIENT NAME: Regina Mays    MR#:  [id number]  DATE OF BIRTH:  12/19/1985  SUBJECTIVE:  patient's family at bedside seen earlier. Overall feels better. Tolerating po diet no fever nausea vomiting.  VITALS:  Blood pressure 114/64, pulse 89, temperature 99.3 F (37.4 C), resp. rate 18, height 5\' 1"  (1.549 m), weight 88.9 kg, last menstrual period 06/04/2017, SpO2 98%.  PHYSICAL EXAMINATION:   GENERAL:  37 y.o.-year-old patient with no acute distress. Poor oral hygiene. Has dental caries LUNGS: Normal breath sounds bilaterally, no wheezing CARDIOVASCULAR: S1, S2 normal. No murmur   ABDOMEN: Soft, nontender, nondistended. Bowel sounds present.  EXTREMITIES: No  edema b/l.    NEUROLOGIC: nonfocal  patient is alert and awake SKIN: No obvious rash, lesion, or ulcer.   LABORATORY PANEL:  CBC Recent Labs  Lab 04/12/23 0858  WBC 6.4  HGB 9.1*  HCT 28.1*  PLT 269    Chemistries  Recent Labs  Lab 04/12/23 0855  NA 138  K 3.9  CL 105  CO2 26  GLUCOSE 90  BUN 13  CREATININE 0.63  CALCIUM 8.2*  AST 7*  ALT 9  ALKPHOS 38  BILITOT 0.4   Cardiac Enzymes No results for input(s): "TROPONINI" in the last 168 hours. RADIOLOGY:  CT Maxillofacial Wo Contrast  Result Date: 04/10/2023 CLINICAL DATA:  Facial pain. EXAM: CT MAXILLOFACIAL WITHOUT CONTRAST TECHNIQUE: Multidetector CT imaging of the maxillofacial structures was performed. Multiplanar CT image reconstructions were also generated. RADIATION DOSE REDUCTION: This exam was performed according to the departmental dose-optimization program which includes automated exposure control, adjustment of the mA and/or kV according to patient size and/or use of iterative reconstruction technique. COMPARISON:  None Available. FINDINGS: Osseous: There is no acute fracture or dislocation identified. No focal osseous lesion. There are periapical lucencies surrounding the right maxillary  lateral incisor as well as bilateral maxillary molar teeth. Orbits: Negative. No traumatic or inflammatory finding. Sinuses: There is mucosal thickening of the right maxillary and sphenoid sinuses. There is an air-fluid level in the right maxillary sinus. There is a polyp or mucous retention cyst in the left maxillary sinus measuring 2.7 x 1.5 by 1.7 cm. The posterior aspect of the nasal septum appears absent which may be related to prior surgery. Soft tissues: Negative. Limited intracranial: No significant or unexpected finding. IMPRESSION: 1. No acute fracture or dislocation of the facial bones. 2. Right maxillary and sphenoid sinus disease. Air-fluid level in the right maxillary sinus may represent acute sinusitis in the appropriate clinical setting. 3. Left maxillary sinus polyp or mucous retention cyst. 4. Periapical lucencies surrounding the right maxillary lateral incisor as well as bilateral maxillary molar teeth. Correlate with dental exam. 5. Posterior aspect of the nasal septum appears absent which may be related to prior surgery. Correlate with surgical history. Electronically Signed   By: Darliss Cheney M.D.   On: 04/10/2023 23:57   CT ABDOMEN PELVIS W CONTRAST  Result Date: 04/10/2023 CLINICAL DATA:  Acute nonlocalized abdominal pain, vomiting, right-sided abdominal pain. EXAM: CT ABDOMEN AND PELVIS WITH CONTRAST TECHNIQUE: Multidetector CT imaging of the abdomen and pelvis was performed using the standard protocol following bolus administration of intravenous contrast. RADIATION DOSE REDUCTION: This exam was performed according to the departmental dose-optimization program which includes automated exposure control, adjustment of the mA and/or kV according to patient size and/or use of iterative reconstruction technique. CONTRAST:  OMNIPAQUE IOHEXOL 300 MG/ML  SOLN COMPARISON:  08/17/2014 FINDINGS: Lower chest: No acute abnormality. Hepatobiliary: No focal liver abnormality is seen. No  gallstones, gallbladder wall thickening, or biliary dilatation. Pancreas: Unremarkable Spleen: Unremarkable Adrenals/Urinary Tract: Adrenal glands are unremarkable. Kidneys are normal, without renal calculi, focal lesion, or hydronephrosis. Bladder is unremarkable. Stomach/Bowel: Stomach is within normal limits. Appendix absent. No evidence of bowel wall thickening, distention, or inflammatory changes. Vascular/Lymphatic: No significant vascular findings are present. No enlarged abdominal or pelvic lymph nodes. Reproductive: Status post hysterectomy. No adnexal masses. Other: None significant Musculoskeletal: No acute or significant osseous findings. IMPRESSION: 1. No acute intra-abdominal pathology identified. No definite radiographic explanation for the patient's reported symptoms. Electronically Signed   By: Helyn Numbers M.D.   On: 04/10/2023 22:57   US Abdomen Limited RUQ (LIVER/GB)  Result Date: 04/10/2023 CLINICAL DATA:  Right upper quadrant pain EXAM: ULTRASOUND ABDOMEN LIMITED RIGHT UPPER QUADRANT COMPARISON:  CT abdomen and pelvis 08/17/2014 FINDINGS: Gallbladder: No gallstones or wall thickening visualized. No sonographic Murphy sign noted by sonographer. Common bile duct: Diameter: 3.8 mm Liver: No focal lesion identified. Within normal limits in parenchymal echogenicity. Portal vein is patent on color Doppler imaging with normal direction of blood flow towards the liver. Other: None. IMPRESSION: Normal right upper quadrant ultrasound. Electronically Signed   By: Darliss Cheney M.D.   On: 04/10/2023 22:34    Assessment and Plan TIFFNY ROMANEK is a 37 y.o. Caucasian female with medical history significant for anxiety, migraine and panic attacks, who presented to the emergency room with acute onset of right upper quadrant abdominal pain with associated severe nasal and sinus congestion with drainage of greenish sputum as well as left upper jaw dental pain.   Sepsis due to undetermined organism  [facility]) Dental infection acute maxillary sinusitis right as noted on CT imaging study -- patient received IV fluids, de-escalate antibiotic to IV cefepime -- curbside ENT to take look at CT scan--does not think this is acute sinus issue./ pt main problem is infected teeth--she has been advised as out tp to see dentist. Has been evaluated at [facility] dental school -- continue PRN pain meds -- white count 25K--- 21K--6K   Acute on Chronic sinusitis - Management as above. - We will add Flonase nasal spray. -- Has seen ENT Dr. 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 Patient also has been referred to [facility] prosthodontics for issues with her dental issues  Abdominal pain with vomiting -- likely could be due to IV pain meds which have been discontinued -- CT abdomen pelvis and right upper quadrant ultrasound nothing acute -- trial of Pepcid AC -- denies constipation. Had BM yesterday --resolved   Anxiety and depression - continue  Seroquel.  Chronic pain syndrome -- patient follows with pain clinic in [place] -- will resume Suboxone   Procedures: Family communication : family at bedside Consults : CODE STATUS: full DVT Prophylaxis : enoxaparin Level of care: Med-Surg Status is: Inpatient Remains inpatient appropriate because: monitor 1 more day    TOTAL TIME TAKING CARE OF THIS PATIENT: 35 minutes.  >50% time spent on counselling and coordination of care  Note: This dictation was prepared with Dragon dictation along with smaller phrase technology. Any transcriptional errors that result from this process are unintentional.  Enedina Finner M.D    [facility]   CC: Primary care physician; Miki Kins, FNP

## 2023-04-13 DIAGNOSIS — A419 Sepsis, unspecified organism: Secondary | ICD-10-CM | POA: Diagnosis not present

## 2023-04-13 MED ORDER — CEFDINIR 300 MG PO CAPS
300.0000 mg | ORAL_CAPSULE | Freq: Two times a day (BID) | ORAL | Status: DC
  Filled 2023-04-13: qty 1

## 2023-04-13 MED ORDER — CEFDINIR 300 MG PO CAPS: 300.0000 mg | ORAL_CAPSULE | Freq: Two times a day (BID) | ORAL | 0 refills | Status: AC

## 2023-04-13 NOTE — Progress Notes (Signed)
IV removed. Discharge education completed. Patient assisted to get dressed by family. Patient being wheeled to the medical mall exit to be discharged to the care of family, in stable condition.  Cornell Barman Jamey Harman

## 2023-04-13 NOTE — Group Note (Deleted)

## 2023-04-13 NOTE — Discharge Instructions (Signed)
Pt to keep her appts with [facility] as per schedule

## 2023-04-13 NOTE — Plan of Care (Signed)
Adequate for discharge. Resolving careplan.  Regina Mays  

## 2023-04-13 NOTE — Discharge Summary (Signed)
Physician Discharge Summary   Patient: Regina Mays MRN: [id number] DOB: November 23, 1985  Admit date:     04/10/2023  Discharge date: 04/13/23  Discharge Physician: Enedina Finner   PCP: Miki Kins, FNP   Recommendations at discharge:    F/u Dr Jenne Campus ENT as needed F/u [facility] prosthodontics dept on our appt F/u PCP in 1-2 weeks  Discharge Diagnoses: Principal Problem:   Sepsis due to undetermined organism [facility]) Active Problems:   Acute sinusitis   Dental infection   Anxiety and depression   Immunosuppression (HCC)  Regina Mays is a 37 y.o. Caucasian female with medical history significant for anxiety, migraine and panic attacks, who presented to the emergency room with acute onset of right upper quadrant abdominal pain with associated severe nasal and sinus congestion with drainage of greenish sputum as well as left upper jaw dental pain.    Sepsis due to undetermined organism [facility]) Dental infection acute maxillary sinusitis right as noted on CT imaging study -- patient received IV fluids, de-escalate antibiotic to IV cefepime -- curbside ENT to take look at CT scan--does not think this is acute sinus issue./ pt main problem is infected teeth--she has been advised as out tp to see dentist. Has been evaluated at [facility] dental [facility] -- continue PRN pain meds -- white count 25K--- 21K--6K --pt feels fine today   Acute on Chronic sinusitis - Management as above. -- Has seen ENT Dr. 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 Patient also has been referred to [facility] prosthodontics for issues with her dental issues   Abdominal pain with vomiting -- likely could be due to IV pain meds which have been discontinued -- CT abdomen pelvis and right upper quadrant ultrasound nothing acute -- trial of Pepcid AC -- denies constipation. Had BM yesterday --resolved    Anxiety and depression - continue  Seroquel.   Chronic pain syndrome -- patient follows with pain clinic in [place] -- will resume Suboxone  Tobacco abuse --advised cessation    D/c home. Pt agreeable   Procedures:none Family communication none today Consults :curbsided ENT CODE STATUS: full DVT Prophylaxis : enoxaparin      Pain control - [place] Controlled Substance Reporting System database was reviewed. and patient was instructed, not to drive, operate heavy machinery, perform activities at heights, swimming or participation in water activities or provide baby-sitting services while on Pain, Sleep and Anxiety Medications; until their outpatient Physician has advised to do so again. Also recommended to not to take more than prescribed Pain, Sleep and Anxiety Medications.  Disposition: Home Diet recommendation:  Discharge Diet Orders (From admission, onward)     Start     Ordered   04/13/23 0000  Diet general        04/13/23 0830           Regular diet DISCHARGE MEDICATION: Allergies as of 04/13/2023       Reactions   Penicillins Diarrhea, Nausea And Vomiting   Has patient had a PCN reaction causing immediate rash, facial/tongue/throat swelling, SOB or lightheadedness with hypotension: yes Has patient had a PCN reaction causing severe rash involving mucus membranes or skin necrosis: no Has patient had a PCN reaction that required hospitalization: no Has patient had a PCN reaction occurring within the last 10 years: yes If all of the above answers are "NO", then may proceed with Cephalosporin use.     SWELLING  OF MOUTH/THROAT AND  Diarrhea, n & v   Hydrocodone Hives, Rash   Toradol [ketorolac Tromethamine] Rash   Tramadol Hives, Rash        Medication List     TAKE these medications    cefdinir 300 MG capsule Commonly known as: OMNICEF Take 1 capsule (300 mg total) by mouth every 12 (twelve) hours for 7 days.   QUEtiapine 300 MG 24 hr  tablet Commonly known as: SEROQUEL XR Take 300 mg by mouth at bedtime.   Suboxone 8-2 MG Film Generic drug: Buprenorphine HCl-Naloxone HCl Place 1 Film under the tongue 3 (three) times daily.        Follow-up Information     Miki Kins, FNP. Schedule an appointment as soon as possible for a visit in 1 week(s).   Specialty: Family Medicine Why: hospital f/u Contact information: [address] [place] [address] [phone]         Patterson Hammersmith, MD. Go to.   Specialty: Rheumatology Why: On your next appt Contact information: [address] [place] [address] [phone]         Linus Salmons, MD. Go to.   Specialty: Otolaryngology Why: As needed, If symptoms worsen Contact information: [address] [place] [address] [phone]                Discharge Exam: Ceasar Mons Weights   04/11/23 0135  Weight: 88.9 kg   GENERAL:  37 y.o.-year-old patient with no acute distress. Poor dentition  LUNGS: Normal breath sounds bilaterally, no wheezing CARDIOVASCULAR: S1, S2 normal. No murmur   ABDOMEN: Soft, nontender, nondistended. Bowel sounds present.  EXTREMITIES: No  edema b/l.    NEUROLOGIC: nonfocal  patient is alert and awake  Condition at discharge: fair  The results of significant diagnostics from this hospitalization (including imaging, microbiology, ancillary and laboratory) are listed below for reference.   Imaging Studies: CT Maxillofacial Wo Contrast  Result Date: 04/10/2023 CLINICAL DATA:  Facial pain. EXAM: CT MAXILLOFACIAL WITHOUT CONTRAST TECHNIQUE: Multidetector CT imaging of the maxillofacial structures was performed. Multiplanar CT image reconstructions were also generated. RADIATION DOSE REDUCTION: This exam was performed according to the departmental dose-optimization program which includes automated exposure control, adjustment of the mA and/or kV according to patient size and/or use of  iterative reconstruction technique. COMPARISON:  None Available. FINDINGS: Osseous: There is no acute fracture or dislocation identified. No focal osseous lesion. There are periapical lucencies surrounding the right maxillary lateral incisor as well as bilateral maxillary molar teeth. Orbits: Negative. No traumatic or inflammatory finding. Sinuses: There is mucosal thickening of the right maxillary and sphenoid sinuses. There is an air-fluid level in the right maxillary sinus. There is a polyp or mucous retention cyst in the left maxillary sinus measuring 2.7 x 1.5 by 1.7 cm. The posterior aspect of the nasal septum appears absent which may be related to prior surgery. Soft tissues: Negative. Limited intracranial: No significant or unexpected finding. IMPRESSION: 1. No acute fracture or dislocation of the facial bones. 2. Right maxillary and sphenoid sinus disease. Air-fluid level in the right maxillary sinus may represent acute sinusitis in the appropriate clinical setting. 3. Left maxillary sinus polyp or mucous retention cyst. 4. Periapical lucencies surrounding the right maxillary lateral incisor as well as bilateral maxillary molar teeth. Correlate with dental exam. 5. Posterior aspect of the nasal septum appears absent which may be related to prior surgery. Correlate with surgical history. Electronically Signed   By:  Darliss Cheney M.D.   On: 04/10/2023 23:57   CT ABDOMEN PELVIS W CONTRAST  Result Date: 04/10/2023 CLINICAL DATA:  Acute nonlocalized abdominal pain, vomiting, right-sided abdominal pain. EXAM: CT ABDOMEN AND PELVIS WITH CONTRAST TECHNIQUE: Multidetector CT imaging of the abdomen and pelvis was performed using the standard protocol following bolus administration of intravenous contrast. RADIATION DOSE REDUCTION: This exam was performed according to the departmental dose-optimization program which includes automated exposure control, adjustment of the mA and/or kV according to patient size and/or  use of iterative reconstruction technique. CONTRAST:  OMNIPAQUE IOHEXOL 300 MG/ML  SOLN COMPARISON:  08/17/2014 FINDINGS: Lower chest: No acute abnormality. Hepatobiliary: No focal liver abnormality is seen. No gallstones, gallbladder wall thickening, or biliary dilatation. Pancreas: Unremarkable Spleen: Unremarkable Adrenals/Urinary Tract: Adrenal glands are unremarkable. Kidneys are normal, without renal calculi, focal lesion, or hydronephrosis. Bladder is unremarkable. Stomach/Bowel: Stomach is within normal limits. Appendix absent. No evidence of bowel wall thickening, distention, or inflammatory changes. Vascular/Lymphatic: No significant vascular findings are present. No enlarged abdominal or pelvic lymph nodes. Reproductive: Status post hysterectomy. No adnexal masses. Other: None significant Musculoskeletal: No acute or significant osseous findings. IMPRESSION: 1. No acute intra-abdominal pathology identified. No definite radiographic explanation for the patient's reported symptoms. Electronically Signed   By: Helyn Numbers M.D.   On: 04/10/2023 22:57   US Abdomen Limited RUQ (LIVER/GB)  Result Date: 04/10/2023 CLINICAL DATA:  Right upper quadrant pain EXAM: ULTRASOUND ABDOMEN LIMITED RIGHT UPPER QUADRANT COMPARISON:  CT abdomen and pelvis 08/17/2014 FINDINGS: Gallbladder: No gallstones or wall thickening visualized. No sonographic Murphy sign noted by sonographer. Common bile duct: Diameter: 3.8 mm Liver: No focal lesion identified. Within normal limits in parenchymal echogenicity. Portal vein is patent on color Doppler imaging with normal direction of blood flow towards the liver. Other: None. IMPRESSION: Normal right upper quadrant ultrasound. Electronically Signed   By: Darliss Cheney M.D.   On: 04/10/2023 22:34    Microbiology: Results for orders placed or performed during the hospital encounter of 05/23/22  Aerobic/Anaerobic Culture w Gram Stain (surgical/deep wound)     Status: None    Collection Time: 05/23/22  1:34 PM   Specimen: PATH ENT biopsy; Tissue  Result Value Ref Range Status   Specimen Description   Final    NASOPHARYNGEAL Performed at [facility], [address], [place], [place] [address]    Special Requests   Final    NONE Performed at [facility], [address] [place], [place] [address]    Gram Stain NO WBC SEEN NO ORGANISMS SEEN   Final   Culture   Final    No growth aerobically or anaerobically. Performed at [facility], [address], [place], [place] [address]    Report Status 05/28/2022 FINAL  Final    Labs: CBC: Recent Labs  Lab 04/10/23 1849 04/11/23 0617 04/12/23 0858  WBC 25.1* 21.5* 6.4  HGB 11.9* 10.0* 9.1*  HCT 36.8 30.1* 28.1*  MCV 85.8 84.8 87.0  PLT 588* 418* 269   Basic Metabolic Panel: Recent Labs  Lab 04/10/23 1849 04/11/23 0617 04/12/23 0855  NA 133* 134* 138  K 4.4 3.8 3.9  CL 97* 104 105  CO2 25 24 26   GLUCOSE 138* 93 90  BUN 25* 19 13  CREATININE 0.79 0.58 0.63  CALCIUM 9.8 8.4* 8.2*   Liver Function Tests: Recent Labs  Lab 04/10/23 1849 04/12/23 0855  AST 8* 7*  ALT 18 9  ALKPHOS 58 38  BILITOT 0.5 0.4  PROT 8.5* 6.0*  ALBUMIN 4.4 3.0*   CBG: Recent Labs  Lab 04/12/23 1147 04/12/23 1556  GLUCAP 94 103*    Discharge time spent: greater than 30 minutes.  Signed: Enedina Finner, MD [facility] 04/13/2023

## 2023-04-16 ENCOUNTER — Encounter: Payer: Self-pay | Admitting: Family

## 2023-04-16 ENCOUNTER — Ambulatory Visit: Payer: Medicaid Other | Admitting: Family

## 2023-04-16 VITALS — BP 110/89 | HR 80 | Ht 61.0 in | Wt 188.6 lb

## 2023-04-16 DIAGNOSIS — K5903 Drug induced constipation: Secondary | ICD-10-CM

## 2023-04-16 DIAGNOSIS — F1120 Opioid dependence, uncomplicated: Secondary | ICD-10-CM

## 2023-04-16 DIAGNOSIS — R490 Dysphonia: Secondary | ICD-10-CM

## 2023-04-16 DIAGNOSIS — S51009A Unspecified open wound of unspecified elbow, initial encounter: Secondary | ICD-10-CM | POA: Insufficient documentation

## 2023-04-16 DIAGNOSIS — M792 Neuralgia and neuritis, unspecified: Secondary | ICD-10-CM | POA: Insufficient documentation

## 2023-04-16 DIAGNOSIS — S5000XA Contusion of unspecified elbow, initial encounter: Secondary | ICD-10-CM | POA: Insufficient documentation

## 2023-04-16 HISTORY — DX: Neuralgia and neuritis, unspecified: M79.2

## 2023-04-16 HISTORY — DX: Contusion of unspecified elbow, initial encounter: S50.00XA

## 2023-04-16 HISTORY — DX: Unspecified open wound of unspecified elbow, initial encounter: S51.009A

## 2023-04-28 ENCOUNTER — Encounter: Payer: Self-pay | Admitting: Family

## 2023-04-28 ENCOUNTER — Ambulatory Visit: Payer: Medicaid Other | Admitting: Family

## 2023-04-28 VITALS — BP 130/82 | HR 94 | Ht 61.0 in | Wt 196.0 lb

## 2023-04-28 DIAGNOSIS — F1120 Opioid dependence, uncomplicated: Secondary | ICD-10-CM | POA: Diagnosis not present

## 2023-04-28 DIAGNOSIS — R490 Dysphonia: Secondary | ICD-10-CM | POA: Diagnosis not present

## 2023-04-28 DIAGNOSIS — K5903 Drug induced constipation: Secondary | ICD-10-CM

## 2023-04-28 DIAGNOSIS — Z0130 Encounter for examination of blood pressure without abnormal findings: Secondary | ICD-10-CM

## 2023-04-28 NOTE — Progress Notes (Signed)
Established Patient Office Visit  Subjective:  Patient ID: Regina Mays, female    DOB: [birth year]  Age: 37 y.o. MRN: [id number]  Chief Complaint  Patient presents with   Follow-up    Hospital Follow-up.    Patient is here today for her hospital follow up.  She went to the ED after our last visit, and she was given an enema, which cleared up her issue.   She says that she feels much better overall.  She does have a disability hearing coming up, asks if we can prepare a letter for her.   She has an upcoming appointment with the maxillofacial surgery dept and [facility] for her palate replacement.  No other concerns at this time.   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  In rehab   Left ankle pain    Left ankle swelling    Long term current use of systemic steroids 06/09/2022   Long-term use of immunosuppressant medication 06/09/2022   Neuropathic pain of left lower extremity 04/16/2023   Open wound of elbow 04/16/2023   Panic attacks    Recurrent major depressive disorder, in remission (HCC) 06/20/2015   Sepsis due to undetermined organism (HCC) 04/11/2023   Wears dentures    partial upper    Past Surgical History:  Procedure Laterality Date   CYSTOSCOPY N/A 07/30/2017   Procedure: CYSTOSCOPY;  Surgeon: Nadara Mustard, MD;  Location: [facility];  Service: Gynecology;  Laterality: N/A;    LAPAROSCOPIC HYSTERECTOMY Bilateral 07/30/2017   Procedure: HYSTERECTOMY TOTAL LAPAROSCOPIC BILATERAL SALPINGECTOMY;  Surgeon: Nadara Mustard, MD;  Location: [facility];  Service: Gynecology;  Laterality: Bilateral;   LEG SURGERY Left 2016   ankle was broken. 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;  Surgeon: Linus Salmons, MD;  Location: [facility];  Service: ENT;  Laterality: N/A;   TUBAL LIGATION  2007   WISDOM TOOTH EXTRACTION      Social History   Socioeconomic History   Marital status: Married    Spouse name: Not on file   Number of children: Not on file   Years of education: Not on file   Highest education level: Not on file  Occupational History   Not on file  Tobacco Use   Smoking status: Every Day    Current packs/day: 0.50    Average packs/day: 0.5 packs/day for 19.0 years (9.5 ttl pk-yrs)    Types: Cigarettes   Smokeless tobacco: Never   Tobacco comments:    Started smoking age 25  Vaping Use   Vaping status: Never Used  Substance and Sexual Activity   Alcohol use: No   Drug use: No    Comment: suboxone    Sexual activity: Yes  Birth control/protection: None  Other Topics Concern   Not on file  Social History Narrative   Not on file   Social Determinants of Health   Financial Resource Strain: Not on file  Food Insecurity: No Food Insecurity (04/11/2023)   Hunger Vital Sign    Worried About Running Out of Food in the Last Year: Never true    Ran Out of Food in the Last Year: Never true  Transportation Needs: No Transportation Needs (04/11/2023)   PRAPARE - Administrator, Civil Service (Medical): No    Lack of Transportation (Non-Medical): No  Physical Activity: Not on file  Stress: Not on file  Social Connections: Not on file  Intimate Partner Violence: Not At Risk (04/11/2023)   Humiliation, Afraid, Rape, and Kick questionnaire     Fear of Current or Ex-Partner: No    Emotionally Abused: No    Physically Abused: No    Sexually Abused: No    Family History  Problem Relation Age of Onset   Hypertension Mother    Cancer Mother    Hypertension Father    Diabetes Maternal Grandmother     Allergies  Allergen Reactions   Penicillins Diarrhea and Nausea And Vomiting    Has patient had a PCN reaction causing immediate rash, facial/tongue/throat swelling, SOB or lightheadedness with hypotension: yes Has patient had a PCN reaction causing severe rash involving mucus membranes or skin necrosis: no Has patient had a PCN reaction that required hospitalization: no Has patient had a PCN reaction occurring within the last 10 years: yes If all of the above answers are "NO", then may proceed with Cephalosporin use.     SWELLING OF MOUTH/THROAT AND  Diarrhea, n & v    Hydrocodone Hives and Rash   Toradol [Ketorolac Tromethamine] Rash   Tramadol Hives and Rash    Review of Systems  HENT:  Positive for congestion.   All other systems reviewed and are negative.     Objective:   BP 130/82   Pulse 94   Ht 5\' 1"  (1.549 m)   Wt 196 lb (88.9 kg)   LMP 06/04/2017 (Exact Date)   SpO2 97%   BMI 37.03 kg/m   Vitals:   04/28/23 0921  BP: 130/82  Pulse: 94  Height: 5\' 1"  (1.549 m)  Weight: 196 lb (88.9 kg)  SpO2: 97%  BMI (Calculated): 37.05    Physical Exam Vitals and nursing note reviewed.  Constitutional:      Appearance: Normal appearance. She is normal weight.  HENT:     Head: Normocephalic.     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  Her voice is difficult to understand as  Eyes:     Extraocular Movements: Extraocular movements intact.     Conjunctiva/sclera: Conjunctivae normal.     Pupils: Pupils are equal, round, and reactive to light.  Cardiovascular:     Rate and Rhythm: Normal rate.  Pulmonary:     Effort: Pulmonary effort is normal.   Neurological:     General: No focal deficit present.     Mental Status: She is alert and oriented to person, place, and time. Mental status is at baseline.  Psychiatric:        Mood and Affect: Mood normal.        Behavior: Behavior normal.        Thought Content: Thought content normal.  Judgment: Judgment normal.     No results found for any visits on 04/28/23.  Recent Results (from the past 2160 hour(s))  Lipase, blood     Status: None   Collection Time: 04/10/23  6:49 PM  Result Value Ref Range   Lipase 19 11 - 51 U/L    Comment: Performed at [facility], [address] [place], [place] [address]  Comprehensive metabolic panel     Status: Abnormal   Collection Time: 04/10/23  6:49 PM  Result Value Ref Range   Sodium 133 (L) 135 - 145 mmol/L   Potassium 4.4 3.5 - 5.1 mmol/L   Chloride 97 (L) 98 - 111 mmol/L   CO2 25 22 - 32 mmol/L   Glucose, Bld 138 (H) 70 - 99 mg/dL    Comment: Glucose reference range applies only to samples taken after fasting for at least 8 hours.   BUN 25 (H) 6 - 20 mg/dL   Creatinine, Ser 5.78 0.44 - 1.00 mg/dL   Calcium 9.8 8.9 - 46.9 mg/dL   Total Protein 8.5 (H) 6.5 - 8.1 g/dL   Albumin 4.4 3.5 - 5.0 g/dL   AST 8 (L) 15 - 41 U/L   ALT 18 0 - 44 U/L   Alkaline Phosphatase 58 38 - 126 U/L   Total Bilirubin 0.5 0.3 - 1.2 mg/dL   GFR, Estimated >62 >95 mL/min    Comment: (NOTE) Calculated using the CKD-EPI Creatinine Equation (2021)    Anion gap 11 5 - 15    Comment: Performed at [facility], [address] [place], [place] [address]  CBC     Status: Abnormal   Collection Time: 04/10/23  6:49 PM  Result Value Ref Range   WBC 25.1 (H) 4.0 - 10.5 K/uL   RBC 4.29 3.87 - 5.11 MIL/uL   Hemoglobin 11.9 (L) 12.0 - 15.0 g/dL   HCT 24.4 01.0 - 27.2 %   MCV 85.8 80.0 - 100.0 fL   MCH 27.7 26.0 - 34.0 pg   MCHC 32.3 30.0 - 36.0 g/dL   RDW 53.6 (H) 64.4 - 03.4 %   Platelets 588 (H) 150 - 400 K/uL   nRBC 0.0 0.0 - 0.2 %     Comment: Performed at [facility], [address] [place], [place] [address]  Lactic acid, plasma     Status: None   Collection Time: 04/10/23  9:25 PM  Result Value Ref Range   Lactic Acid, Venous 1.1 0.5 - 1.9 mmol/L    Comment: Performed at [facility], [address] [place], [place] [address]  HIV Antibody (routine testing w rflx)     Status: None   Collection Time: 04/11/23  6:17 AM  Result Value Ref Range   HIV Screen 4th Generation wRfx Non Reactive Non Reactive    Comment: Performed at [facility], [address] [place], [place] [address]  Protime-INR     Status: None   Collection Time: 04/11/23  6:17 AM  Result Value Ref Range   Prothrombin Time 13.9 11.4 - 15.2 seconds   INR 1.1 0.8 - 1.2    Comment: (NOTE) INR goal varies based on device and disease states. Performed at [facility], [address] [place], [place] [address]   Cortisol-am, blood     Status: Abnormal   Collection Time: 04/11/23  6:17 AM  Result Value Ref Range   Cortisol - AM 1.7 (L) 6.7 - 22.6 ug/dL  Comment: Performed at [facility], [address], [place], [place] [address]  Procalcitonin     Status: None   Collection Time: 04/11/23  6:17 AM  Result Value Ref Range   Procalcitonin <0.10 ng/mL    Comment:        Interpretation: PCT (Procalcitonin) <= 0.5 ng/mL: Systemic infection (sepsis) is not likely. Local bacterial infection is possible. (NOTE)       Sepsis PCT Algorithm           Lower Respiratory Tract                                      Infection PCT Algorithm    ----------------------------     ----------------------------         PCT < 0.25 ng/mL                PCT < 0.10 ng/mL          Strongly encourage             Strongly discourage   discontinuation of antibiotics    initiation of antibiotics    ----------------------------     -----------------------------       PCT 0.25 - 0.50 ng/mL            PCT 0.10 - 0.25 ng/mL                OR       >80% decrease in PCT            Discourage initiation of                                            antibiotics      Encourage discontinuation           of antibiotics    ----------------------------     -----------------------------         PCT >= 0.50 ng/mL              PCT 0.26 - 0.50 ng/mL               AND        <80% decrease in PCT             Encourage initiation of                                             antibiotics       Encourage continuation           of antibiotics    ----------------------------     -----------------------------        PCT >= 0.50 ng/mL                  PCT > 0.50 ng/mL               AND         increase in PCT                  Strongly encourage  initiation of antibiotics    Strongly encourage escalation           of antibiotics                                     -----------------------------                                           PCT <= 0.25 ng/mL                                                 OR                                        > 80% decrease in PCT                                      Discontinue / Do not initiate                                             antibiotics  Performed at [facility], [address] [place], [place] [address]   Basic metabolic panel     Status: Abnormal   Collection Time: 04/11/23  6:17 AM  Result Value Ref Range   Sodium 134 (L) 135 - 145 mmol/L   Potassium 3.8 3.5 - 5.1 mmol/L   Chloride 104 98 - 111 mmol/L   CO2 24 22 - 32 mmol/L   Glucose, Bld 93 70 - 99 mg/dL    Comment: Glucose reference range applies only to samples taken after fasting for at least 8 hours.   BUN 19 6 - 20 mg/dL   Creatinine, Ser 9.56 0.44 - 1.00 mg/dL   Calcium 8.4 (L) 8.9 - 10.3 mg/dL   GFR, Estimated >21 >30 mL/min    Comment: (NOTE) Calculated using the CKD-EPI Creatinine Equation (2021)    Anion gap 6 5 - 15    Comment: Performed at [facility], [address] [place], [place] [address]  CBC     Status: Abnormal   Collection Time: 04/11/23  6:17 AM  Result Value Ref Range   WBC 21.5 (H) 4.0 - 10.5 K/uL   RBC 3.55 (L) 3.87 - 5.11 MIL/uL   Hemoglobin 10.0 (L) 12.0 - 15.0 g/dL   HCT 46.9 (L) 62.9 - 52.8 %   MCV 84.8 80.0 - 100.0 fL   MCH 28.2 26.0 - 34.0 pg   MCHC 33.2 30.0 - 36.0 g/dL   RDW 41.3 (H) 24.4 - 01.0 %   Platelets 418 (H) 150 - 400 K/uL   nRBC 0.0 0.0 - 0.2 %    Comment: Performed at [facility], [address], [place], [place] [address]  Comprehensive metabolic panel     Status: Abnormal   Collection Time: 04/12/23  8:55 AM  Result Value Ref Range  Sodium 138 135 - 145 mmol/L   Potassium 3.9 3.5 - 5.1 mmol/L   Chloride 105 98 - 111 mmol/L   CO2 26 22 - 32 mmol/L   Glucose, Bld 90 70 - 99 mg/dL    Comment: Glucose reference range applies only to samples taken after fasting for at least 8 hours.   BUN 13 6 - 20 mg/dL   Creatinine, Ser 8.65 0.44 - 1.00 mg/dL   Calcium 8.2 (L) 8.9 - 10.3 mg/dL   Total Protein 6.0 (L) 6.5 - 8.1 g/dL   Albumin 3.0 (L) 3.5 - 5.0 g/dL   AST 7 (L) 15 - 41 U/L   ALT 9 0 - 44 U/L   Alkaline Phosphatase 38 38 - 126 U/L   Total Bilirubin 0.4 0.3 - 1.2 mg/dL   GFR, Estimated >78 >46 mL/min    Comment: (NOTE) Calculated using the CKD-EPI Creatinine Equation (2021)    Anion gap 7 5 - 15    Comment: Performed at [facility], [address] [place], [place] [address]  CBC     Status: Abnormal   Collection Time: 04/12/23  8:58 AM  Result Value Ref Range   WBC 6.4 4.0 - 10.5 K/uL   RBC 3.23 (L) 3.87 - 5.11 MIL/uL   Hemoglobin 9.1 (L) 12.0 - 15.0 g/dL   HCT 28.4 (L) 13.2 - 44.0 %   MCV 87.0 80.0 - 100.0 fL   MCH 28.2 26.0 - 34.0 pg   MCHC 32.4 30.0 - 36.0 g/dL   RDW 10.2 72.5 - 36.6 %   Platelets 269 150 - 400 K/uL   nRBC 0.0 0.0 - 0.2 %    Comment: Performed at [facility], [address] [place], [place] [address]  Glucose,  capillary     Status: None   Collection Time: 04/12/23 11:47 AM  Result Value Ref Range   Glucose-Capillary 94 70 - 99 mg/dL    Comment: Glucose reference range applies only to samples taken after fasting for at least 8 hours.  Urinalysis, Routine w reflex microscopic -Urine, Clean Catch     Status: Abnormal   Collection Time: 04/12/23 11:52 AM  Result Value Ref Range   Color, Urine YELLOW (A) YELLOW   APPearance CLOUDY (A) CLEAR   Specific Gravity, Urine 1.013 1.005 - 1.030   pH 5.0 5.0 - 8.0   Glucose, UA NEGATIVE NEGATIVE mg/dL   Hgb urine dipstick SMALL (A) NEGATIVE   Bilirubin Urine NEGATIVE NEGATIVE   Ketones, ur NEGATIVE NEGATIVE mg/dL   Protein, ur NEGATIVE NEGATIVE mg/dL   Nitrite NEGATIVE NEGATIVE   Leukocytes,Ua NEGATIVE NEGATIVE   RBC / HPF 0-5 0 - 5 RBC/hpf   WBC, UA 0-5 0 - 5 WBC/hpf   Bacteria, UA MANY (A) NONE SEEN   Squamous Epithelial / HPF 11-20 0 - 5 /HPF   Mucus PRESENT     Comment: Performed at [facility], [address] [place], [place] [address]  Glucose, capillary     Status: Abnormal   Collection Time: 04/12/23  3:56 PM  Result Value Ref Range   Glucose-Capillary 103 (H) 70 - 99 mg/dL    Comment: Glucose reference range applies only to samples taken after fasting for at least 8 hours.       Assessment & Plan:   Problem List Items Addressed This Visit       Active Problems   Narcotic dependence [facility])    Patient is currently in remission under  MAT and is well managed.  Will defer to those providers for further changes to her POC.       Drug-induced constipation - Primary    I have asked that patient continue to take medications for this, as it is likely to recur, given her use of suboxone.  If stool softeners are not effective, to use miralax.  If still not effective, asked pt to let me know.   Recheck at follow up.       Other Visit Diagnoses     Dysphonia           No follow-ups on file.   Total time spent: 20  minutes  BATUL MENTING, FNP  04/28/2023   This document may have been prepared by [facility] Voice Recognition software and as such may include unintentional dictation errors.

## 2023-04-30 ENCOUNTER — Telehealth: Payer: Self-pay | Admitting: Family

## 2023-04-30 NOTE — Telephone Encounter (Signed)
Dr. Griffith Citron at [facility] with [facility] is calling requesting a letter of medical necessity that will require a palatal lift for her dentures. Needs ASAP to send to Medicaid.  Email to [email or web address] when completed.

## 2023-05-05 ENCOUNTER — Emergency Department
Admission: EM | Admit: 2023-05-05 | Discharge: 2023-05-05 | Disposition: A | Discharge status: Home / Self Care | Payer: Medicaid Other | Attending: Emergency Medicine | Admitting: Emergency Medicine

## 2023-05-05 ENCOUNTER — Other Ambulatory Visit: Payer: Self-pay

## 2023-05-05 DIAGNOSIS — Y92410 Unspecified street and highway as the place of occurrence of the external cause: Secondary | ICD-10-CM | POA: Diagnosis not present

## 2023-05-05 DIAGNOSIS — S39012A Strain of muscle, fascia and tendon of lower back, initial encounter: Secondary | ICD-10-CM | POA: Insufficient documentation

## 2023-05-05 DIAGNOSIS — S161XXA Strain of muscle, fascia and tendon at neck level, initial encounter: Secondary | ICD-10-CM | POA: Diagnosis not present

## 2023-05-05 DIAGNOSIS — S199XXA Unspecified injury of neck, initial encounter: Secondary | ICD-10-CM | POA: Diagnosis present

## 2023-05-05 MED ORDER — METHOCARBAMOL 500 MG PO TABS: 500.0000 mg | ORAL_TABLET | Freq: Three times a day (TID) | ORAL | 1 refills | Status: AC | PRN

## 2023-05-05 NOTE — ED Notes (Signed)
Pt verbalizes understanding of discharge instructions. Opportunity for questioning and answers were provided. Pt discharged from ED to home with family.    
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
## 2023-05-05 NOTE — Telephone Encounter (Signed)
Per Nadia this has been sent - ao

## 2023-05-08 ENCOUNTER — Ambulatory Visit: Payer: Medicaid Other | Admitting: Family

## 2023-05-08 VITALS — BP 125/95 | HR 88 | Ht 61.0 in | Wt 194.8 lb

## 2023-05-08 DIAGNOSIS — K047 Periapical abscess without sinus: Secondary | ICD-10-CM | POA: Diagnosis not present

## 2023-05-08 MED ORDER — METRONIDAZOLE 500 MG PO TABS: 500.0000 mg | ORAL_TABLET | Freq: Three times a day (TID) | ORAL | 0 refills | Status: DC

## 2023-05-08 MED ORDER — CLINDAMYCIN HCL 300 MG PO CAPS: 300.0000 mg | ORAL_CAPSULE | Freq: Three times a day (TID) | ORAL | 0 refills | Status: AC

## 2023-05-08 MED ORDER — FLUCONAZOLE 150 MG PO TABS: 150.0000 mg | ORAL_TABLET | ORAL | 0 refills | Status: AC

## 2023-05-09 ENCOUNTER — Encounter: Payer: Self-pay | Admitting: Family

## 2023-05-09 NOTE — Progress Notes (Signed)
Acute Office Visit  Subjective:     Patient ID: Regina Mays, female    DOB: 1985/10/01, 37 y.o.   MRN: [id number]  Patient is in today for  Chief Complaint  Patient presents with   Acute Visit    Left side of face swollen     Dental Pain  This is a recurrent problem. The current episode started more than 1 month ago. The problem occurs constantly. The problem has been waxing and waning. The pain is at a severity of 7/10. The pain is moderate. Associated symptoms include facial pain (swelling). She has tried acetaminophen, heat, ice and NSAIDs for the symptoms. The treatment provided mild relief.     Review of Systems  HENT:         Pain and swelling on left side of face at lower jaw.  All other systems reviewed and are negative.       Objective:    BP (!) 125/95   Pulse 88   Ht 5\' 1"  (1.549 m)   Wt 194 lb 12.8 oz (88.4 kg)   LMP 06/04/2017 (Exact Date)   SpO2 99%   BMI 36.81 kg/m   Physical Exam Vitals and nursing note reviewed.  Constitutional:      Appearance: Normal appearance. She is normal weight.  HENT:     Head: Normocephalic.     Mouth/Throat:     Dentition: Dental abscesses present.     Comments: Dental abscess to lower jaw.  Eyes:     Pupils: Pupils are equal, round, and reactive to light.  Cardiovascular:     Rate and Rhythm: Normal rate.  Pulmonary:     Effort: Pulmonary effort is normal.  Neurological:     General: No focal deficit present.     Mental Status: She is alert and oriented to person, place, and time. Mental status is at baseline.  Psychiatric:        Mood and Affect: Mood normal.        Behavior: Behavior normal.        Thought Content: Thought content normal.        Judgment: Judgment normal.     No results found for any visits on 05/08/23.  Recent Results (from the past 2160 hour(s))  Lipase, blood     Status: None   Collection Time: 04/10/23  6:49 PM  Result Value Ref Range   Lipase 19 11 - 51 U/L    Comment:  Performed at [facility], [address] [place], [place] [address]  Comprehensive metabolic panel     Status: Abnormal   Collection Time: 04/10/23  6:49 PM  Result Value Ref Range   Sodium 133 (L) 135 - 145 mmol/L   Potassium 4.4 3.5 - 5.1 mmol/L   Chloride 97 (L) 98 - 111 mmol/L   CO2 25 22 - 32 mmol/L   Glucose, Bld 138 (H) 70 - 99 mg/dL    Comment: Glucose reference range applies only to samples taken after fasting for at least 8 hours.   BUN 25 (H) 6 - 20 mg/dL   Creatinine, Ser 3.08 0.44 - 1.00 mg/dL   Calcium 9.8 8.9 - 65.7 mg/dL   Total Protein 8.5 (H) 6.5 - 8.1 g/dL   Albumin 4.4 3.5 - 5.0 g/dL   AST 8 (L) 15 - 41 U/L   ALT 18 0 - 44 U/L   Alkaline Phosphatase 58 38 - 126 U/L   Total Bilirubin 0.5 0.3 -  1.2 mg/dL   GFR, Estimated >16 >10 mL/min    Comment: (NOTE) Calculated using the CKD-EPI Creatinine Equation (2021)    Anion gap 11 5 - 15    Comment: Performed at [facility], [address] [place], [place] [address]  CBC     Status: Abnormal   Collection Time: 04/10/23  6:49 PM  Result Value Ref Range   WBC 25.1 (H) 4.0 - 10.5 K/uL   RBC 4.29 3.87 - 5.11 MIL/uL   Hemoglobin 11.9 (L) 12.0 - 15.0 g/dL   HCT 40.9 81.1 - 91.4 %   MCV 85.8 80.0 - 100.0 fL   MCH 27.7 26.0 - 34.0 pg   MCHC 32.3 30.0 - 36.0 g/dL   RDW 78.2 (H) 95.6 - 21.3 %   Platelets 588 (H) 150 - 400 K/uL   nRBC 0.0 0.0 - 0.2 %    Comment: Performed at [facility], [address] [place], [place] [address]  Lactic acid, plasma     Status: None   Collection Time: 04/10/23  9:25 PM  Result Value Ref Range   Lactic Acid, Venous 1.1 0.5 - 1.9 mmol/L    Comment: Performed at [facility], [address] [place], [place] [address]  HIV Antibody (routine testing w rflx)     Status: None   Collection Time: 04/11/23  6:17 AM  Result Value Ref Range   HIV Screen 4th Generation wRfx Non Reactive Non Reactive    Comment: Performed at [facility],  [address] [place], [place] [address]  Protime-INR     Status: None   Collection Time: 04/11/23  6:17 AM  Result Value Ref Range   Prothrombin Time 13.9 11.4 - 15.2 seconds   INR 1.1 0.8 - 1.2    Comment: (NOTE) INR goal varies based on device and disease states. Performed at [facility], [address] [place], [place] [address]   Cortisol-am, blood     Status: Abnormal   Collection Time: 04/11/23  6:17 AM  Result Value Ref Range   Cortisol - AM 1.7 (L) 6.7 - 22.6 ug/dL    Comment: Performed at [facility] [place] [facility], [address] [place], [place] [address]  Procalcitonin     Status: None   Collection Time: 04/11/23  6:17 AM  Result Value Ref Range   Procalcitonin <0.10 ng/mL    Comment:        Interpretation: PCT (Procalcitonin) <= 0.5 ng/mL: Systemic infection (sepsis) is not likely. Local bacterial infection is possible. (NOTE)       Sepsis PCT Algorithm           Lower Respiratory Tract                                      Infection PCT Algorithm    ----------------------------     ----------------------------         PCT < 0.25 ng/mL                PCT < 0.10 ng/mL          Strongly encourage             Strongly discourage   discontinuation of antibiotics    initiation of antibiotics    ----------------------------     -----------------------------       PCT 0.25 - 0.50 ng/mL  PCT 0.10 - 0.25 ng/mL               OR       >80% decrease in PCT            Discourage initiation of                                            antibiotics      Encourage discontinuation           of antibiotics    ----------------------------     -----------------------------         PCT >= 0.50 ng/mL              PCT 0.26 - 0.50 ng/mL               AND        <80% decrease in PCT             Encourage initiation of                                             antibiotics       Encourage continuation           of antibiotics    ----------------------------      -----------------------------        PCT >= 0.50 ng/mL                  PCT > 0.50 ng/mL               AND         increase in PCT                  Strongly encourage                                      initiation of antibiotics    Strongly encourage escalation           of antibiotics                                     -----------------------------                                           PCT <= 0.25 ng/mL                                                 OR                                        > 80% decrease in PCT  Discontinue / Do not initiate                                             antibiotics  Performed at [facility], [address] [place], [place] [address]   Basic metabolic panel     Status: Abnormal   Collection Time: 04/11/23  6:17 AM  Result Value Ref Range   Sodium 134 (L) 135 - 145 mmol/L   Potassium 3.8 3.5 - 5.1 mmol/L   Chloride 104 98 - 111 mmol/L   CO2 24 22 - 32 mmol/L   Glucose, Bld 93 70 - 99 mg/dL    Comment: Glucose reference range applies only to samples taken after fasting for at least 8 hours.   BUN 19 6 - 20 mg/dL   Creatinine, Ser 1.19 0.44 - 1.00 mg/dL   Calcium 8.4 (L) 8.9 - 10.3 mg/dL   GFR, Estimated >14 >78 mL/min    Comment: (NOTE) Calculated using the CKD-EPI Creatinine Equation (2021)    Anion gap 6 5 - 15    Comment: Performed at [facility], [address] [place], [place] [address]  CBC     Status: Abnormal   Collection Time: 04/11/23  6:17 AM  Result Value Ref Range   WBC 21.5 (H) 4.0 - 10.5 K/uL   RBC 3.55 (L) 3.87 - 5.11 MIL/uL   Hemoglobin 10.0 (L) 12.0 - 15.0 g/dL   HCT 13.0 (L) 86.5 - 78.4 %   MCV 84.8 80.0 - 100.0 fL   MCH 28.2 26.0 - 34.0 pg   MCHC 33.2 30.0 - 36.0 g/dL   RDW 69.6 (H) 29.5 - 28.4 %   Platelets 418 (H) 150 - 400 K/uL   nRBC 0.0 0.0 - 0.2 %    Comment: Performed at [facility], [address] [place], [place] [address]   Comprehensive metabolic panel     Status: Abnormal   Collection Time: 04/12/23  8:55 AM  Result Value Ref Range   Sodium 138 135 - 145 mmol/L   Potassium 3.9 3.5 - 5.1 mmol/L   Chloride 105 98 - 111 mmol/L   CO2 26 22 - 32 mmol/L   Glucose, Bld 90 70 - 99 mg/dL    Comment: Glucose reference range applies only to samples taken after fasting for at least 8 hours.   BUN 13 6 - 20 mg/dL   Creatinine, Ser 0.10 0.44 - 1.00 mg/dL   Calcium 8.2 (L) 8.9 - 10.3 mg/dL   Total Protein 6.0 (L) 6.5 - 8.1 g/dL   Albumin 3.0 (L) 3.5 - 5.0 g/dL   AST 7 (L) 15 - 41 U/L   ALT 9 0 - 44 U/L   Alkaline Phosphatase 38 38 - 126 U/L   Total Bilirubin 0.4 0.3 - 1.2 mg/dL   GFR, Estimated >27 >25 mL/min    Comment: (NOTE) Calculated using the CKD-EPI Creatinine Equation (2021)    Anion gap 7 5 - 15    Comment: Performed at [facility], [address] [place], [place] [address]  CBC     Status: Abnormal   Collection Time: 04/12/23  8:58 AM  Result Value Ref Range   WBC 6.4 4.0 - 10.5 K/uL   RBC 3.23 (L) 3.87 - 5.11 MIL/uL   Hemoglobin 9.1 (L) 12.0 - 15.0 g/dL   HCT 03.4 (  L) 36.0 - 46.0 %   MCV 87.0 80.0 - 100.0 fL   MCH 28.2 26.0 - 34.0 pg   MCHC 32.4 30.0 - 36.0 g/dL   RDW 44.0 34.7 - 42.5 %   Platelets 269 150 - 400 K/uL   nRBC 0.0 0.0 - 0.2 %    Comment: Performed at [facility], [address] [place], [place] [address]  Glucose, capillary     Status: None   Collection Time: 04/12/23 11:47 AM  Result Value Ref Range   Glucose-Capillary 94 70 - 99 mg/dL    Comment: Glucose reference range applies only to samples taken after fasting for at least 8 hours.  Urinalysis, Routine w reflex microscopic -Urine, Clean Catch     Status: Abnormal   Collection Time: 04/12/23 11:52 AM  Result Value Ref Range   Color, Urine YELLOW (A) YELLOW   APPearance CLOUDY (A) CLEAR   Specific Gravity, Urine 1.013 1.005 - 1.030   pH 5.0 5.0 - 8.0   Glucose, UA NEGATIVE NEGATIVE mg/dL   Hgb  urine dipstick SMALL (A) NEGATIVE   Bilirubin Urine NEGATIVE NEGATIVE   Ketones, ur NEGATIVE NEGATIVE mg/dL   Protein, ur NEGATIVE NEGATIVE mg/dL   Nitrite NEGATIVE NEGATIVE   Leukocytes,Ua NEGATIVE NEGATIVE   RBC / HPF 0-5 0 - 5 RBC/hpf   WBC, UA 0-5 0 - 5 WBC/hpf   Bacteria, UA MANY (A) NONE SEEN   Squamous Epithelial / HPF 11-20 0 - 5 /HPF   Mucus PRESENT     Comment: Performed at [facility], [address] [place], [place] [address]  Glucose, capillary     Status: Abnormal   Collection Time: 04/12/23  3:56 PM  Result Value Ref Range   Glucose-Capillary 103 (H) 70 - 99 mg/dL    Comment: Glucose reference range applies only to samples taken after fasting for at least 8 hours.       Assessment & Plan:   Problem List Items Addressed This Visit   None Visit Diagnoses     Periapical abscess    -  Primary   sending antibiotics.  Patient has appointments soon for her dental care.        Return as previously scheduled unless not improving..  Total time spent: 20 minutes  BRENDALYNN DANESI, FNP  05/08/2023   This document may have been prepared by [facility] Voice Recognition software and as such may include unintentional dictation errors.

## 2023-05-16 ENCOUNTER — Encounter: Payer: Self-pay | Admitting: Family

## 2023-05-16 NOTE — Assessment & Plan Note (Signed)
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  Will defer to those providers for further changes to her POC.

## 2023-05-16 NOTE — Progress Notes (Signed)
Established Patient Office Visit  Subjective:  Patient ID: Regina Mays, female    DOB: 1985-10-21  Age: 37 y.o. MRN: [id number]  Chief Complaint  Patient presents with   Follow-up    Hospital F/U    Patient is here for her follow up after her visit to the ED.  She says that she was found to be very constipated, and they did an enema, which she says helped almost immediately.   She feels much better today. Has an appointment soon to build up her palate so that she can use dentures without any physical difficulty.   She is doing well overall.   No other concerns at this time.   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  In rehab   Left ankle pain    Left ankle swelling    Long term current use of systemic steroids 06/09/2022   Long-term use of immunosuppressant medication 06/09/2022   Neuropathic pain of left lower extremity 04/16/2023   Open wound of elbow 04/16/2023   Panic attacks    Recurrent major depressive disorder, in remission (HCC) 06/20/2015   Wears dentures    partial upper    Past Surgical History:  Procedure Laterality Date   CYSTOSCOPY N/A 07/30/2017   Procedure: CYSTOSCOPY;  Surgeon: Nadara Mustard, MD;  Location: [facility];  Service: Gynecology;  Laterality: N/A;   LAPAROSCOPIC HYSTERECTOMY Bilateral 07/30/2017   Procedure: HYSTERECTOMY TOTAL LAPAROSCOPIC BILATERAL SALPINGECTOMY;  Surgeon:  Nadara Mustard, MD;  Location: [facility];  Service: Gynecology;  Laterality: Bilateral;   LEG SURGERY Left 2016   ankle was broken. 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;  Surgeon: Linus Salmons, MD;  Location: [facility];  Service: ENT;  Laterality: N/A;   TUBAL LIGATION  2007   WISDOM TOOTH EXTRACTION      Social History   Socioeconomic History   Marital status: Married    Spouse name: Not on file   Number of children: Not on file   Years of education: Not on file   Highest education level: Not on file  Occupational History   Not on file  Tobacco Use   Smoking status: Every Day    Current packs/day: 0.50    Average packs/day: 0.5 packs/day for 19.0 years (9.5 ttl pk-yrs)    Types: Cigarettes   Smokeless tobacco: Never   Tobacco comments:    Started smoking age 41  Vaping Use   Vaping status: Never Used  Substance and Sexual Activity   Alcohol use: No   Drug use: No    Comment: suboxone    Sexual activity: Yes    Birth control/protection: None  Other Topics Concern   Not on file  Social History Narrative   Not on file  Social Determinants of Health   Financial Resource Strain: Not on file  Food Insecurity: No Food Insecurity (04/11/2023)   Hunger Vital Sign    Worried About Running Out of Food in the Last Year: Never true    Ran Out of Food in the Last Year: Never true  Transportation Needs: No Transportation Needs (04/11/2023)   PRAPARE - Administrator, Civil Service (Medical): No    Lack of Transportation (Non-Medical): No  Physical Activity: Not on file  Stress: Not on file  Social Connections: Not on file  Intimate Partner Violence: Not At Risk (04/11/2023)   Humiliation, Afraid, Rape, and Kick questionnaire    Fear of Current or Ex-Partner: No    Emotionally Abused: No    Physically Abused: No    Sexually Abused: No    Family  History  Problem Relation Age of Onset   Hypertension Mother    Cancer Mother    Hypertension Father    Diabetes Maternal Grandmother     Allergies  Allergen Reactions   Penicillins Diarrhea and Nausea And Vomiting    Has patient had a PCN reaction causing immediate rash, facial/tongue/throat swelling, SOB or lightheadedness with hypotension: yes Has patient had a PCN reaction causing severe rash involving mucus membranes or skin necrosis: no Has patient had a PCN reaction that required hospitalization: no Has patient had a PCN reaction occurring within the last 10 years: yes If all of the above answers are "NO", then may proceed with Cephalosporin use.     SWELLING OF MOUTH/THROAT AND  Diarrhea, n & v    Hydrocodone Hives and Rash   Toradol [Ketorolac Tromethamine] Rash   Tramadol Hives and Rash    ROS     Objective:   BP 110/89   Pulse 80   Ht 5\' 1"  (1.549 m)   Wt 188 lb 9.6 oz (85.5 kg)   LMP 06/04/2017 (Exact Date)   SpO2 97%   BMI 35.64 kg/m   Vitals:   04/16/23 1018  BP: 110/89  Pulse: 80  Height: 5\' 1"  (1.549 m)  Weight: 188 lb 9.6 oz (85.5 kg)  SpO2: 97%  BMI (Calculated): 35.65    Physical Exam   No results found for any visits on 04/16/23.  Recent Results (from the past 2160 hour(s))  Lipase, blood     Status: None   Collection Time: 04/10/23  6:49 PM  Result Value Ref Range   Lipase 19 11 - 51 U/L    Comment: Performed at [facility], [address] [place], [place] [address]  Comprehensive metabolic panel     Status: Abnormal   Collection Time: 04/10/23  6:49 PM  Result Value Ref Range   Sodium 133 (L) 135 - 145 mmol/L   Potassium 4.4 3.5 - 5.1 mmol/L   Chloride 97 (L) 98 - 111 mmol/L   CO2 25 22 - 32 mmol/L   Glucose, Bld 138 (H) 70 - 99 mg/dL    Comment: Glucose reference range applies only to samples taken after fasting for at least 8 hours.   BUN 25 (H) 6 - 20 mg/dL   Creatinine, Ser 6.57 0.44 - 1.00 mg/dL   Calcium 9.8  8.9 - 84.6 mg/dL   Total Protein 8.5 (H) 6.5 - 8.1 g/dL   Albumin 4.4 3.5 - 5.0 g/dL   AST 8 (L) 15 - 41 U/L   ALT 18 0 - 44 U/L   Alkaline Phosphatase 58 38 - 126  U/L   Total Bilirubin 0.5 0.3 - 1.2 mg/dL   GFR, Estimated >51 >88 mL/min    Comment: (NOTE) Calculated using the CKD-EPI Creatinine Equation (2021)    Anion gap 11 5 - 15    Comment: Performed at [facility], [address] [place], [place] [address]  CBC     Status: Abnormal   Collection Time: 04/10/23  6:49 PM  Result Value Ref Range   WBC 25.1 (H) 4.0 - 10.5 K/uL   RBC 4.29 3.87 - 5.11 MIL/uL   Hemoglobin 11.9 (L) 12.0 - 15.0 g/dL   HCT 63.0 16.0 - 10.9 %   MCV 85.8 80.0 - 100.0 fL   MCH 27.7 26.0 - 34.0 pg   MCHC 32.3 30.0 - 36.0 g/dL   RDW 32.3 (H) 55.7 - 32.2 %   Platelets 588 (H) 150 - 400 K/uL   nRBC 0.0 0.0 - 0.2 %    Comment: Performed at [facility], [address] [place], [place] [address]  Lactic acid, plasma     Status: None   Collection Time: 04/10/23  9:25 PM  Result Value Ref Range   Lactic Acid, Venous 1.1 0.5 - 1.9 mmol/L    Comment: Performed at [facility], [address] [place], [place] [address]  HIV Antibody (routine testing w rflx)     Status: None   Collection Time: 04/11/23  6:17 AM  Result Value Ref Range   HIV Screen 4th Generation wRfx Non Reactive Non Reactive    Comment: Performed at [facility], [address], [place], [place] [address]  Protime-INR     Status: None   Collection Time: 04/11/23  6:17 AM  Result Value Ref Range   Prothrombin Time 13.9 11.4 - 15.2 seconds   INR 1.1 0.8 - 1.2    Comment: (NOTE) INR goal varies based on device and disease states. Performed at [facility], [address] [place], [place] [address]   Cortisol-am, blood     Status: Abnormal   Collection Time: 04/11/23  6:17 AM  Result Value Ref Range   Cortisol - AM 1.7 (L) 6.7 - 22.6 ug/dL    Comment: Performed at [facility],  [address] [place], [place] [address]  Procalcitonin     Status: None   Collection Time: 04/11/23  6:17 AM  Result Value Ref Range   Procalcitonin <0.10 ng/mL    Comment:        Interpretation: PCT (Procalcitonin) <= 0.5 ng/mL: Systemic infection (sepsis) is not likely. Local bacterial infection is possible. (NOTE)       Sepsis PCT Algorithm           Lower Respiratory Tract                                      Infection PCT Algorithm    ----------------------------     ----------------------------         PCT < 0.25 ng/mL                PCT < 0.10 ng/mL          Strongly encourage             Strongly discourage   discontinuation of antibiotics    initiation of antibiotics    ----------------------------     -----------------------------  PCT 0.25 - 0.50 ng/mL            PCT 0.10 - 0.25 ng/mL               OR       >80% decrease in PCT            Discourage initiation of                                            antibiotics      Encourage discontinuation           of antibiotics    ----------------------------     -----------------------------         PCT >= 0.50 ng/mL              PCT 0.26 - 0.50 ng/mL               AND        <80% decrease in PCT             Encourage initiation of                                             antibiotics       Encourage continuation           of antibiotics    ----------------------------     -----------------------------        PCT >= 0.50 ng/mL                  PCT > 0.50 ng/mL               AND         increase in PCT                  Strongly encourage                                      initiation of antibiotics    Strongly encourage escalation           of antibiotics                                     -----------------------------                                           PCT <= 0.25 ng/mL                                                 OR                                        > 80% decrease in PCT  Discontinue / Do not initiate                                             antibiotics  Performed at [facility], [address] [place], [place] [address]   Basic metabolic panel     Status: Abnormal   Collection Time: 04/11/23  6:17 AM  Result Value Ref Range   Sodium 134 (L) 135 - 145 mmol/L   Potassium 3.8 3.5 - 5.1 mmol/L   Chloride 104 98 - 111 mmol/L   CO2 24 22 - 32 mmol/L   Glucose, Bld 93 70 - 99 mg/dL    Comment: Glucose reference range applies only to samples taken after fasting for at least 8 hours.   BUN 19 6 - 20 mg/dL   Creatinine, Ser 6.21 0.44 - 1.00 mg/dL   Calcium 8.4 (L) 8.9 - 10.3 mg/dL   GFR, Estimated >30 >86 mL/min    Comment: (NOTE) Calculated using the CKD-EPI Creatinine Equation (2021)    Anion gap 6 5 - 15    Comment: Performed at [facility], [address] [place], [place] [address]  CBC     Status: Abnormal   Collection Time: 04/11/23  6:17 AM  Result Value Ref Range   WBC 21.5 (H) 4.0 - 10.5 K/uL   RBC 3.55 (L) 3.87 - 5.11 MIL/uL   Hemoglobin 10.0 (L) 12.0 - 15.0 g/dL   HCT 96.2 (L) 95.2 - 84.1 %   MCV 84.8 80.0 - 100.0 fL   MCH 28.2 26.0 - 34.0 pg   MCHC 33.2 30.0 - 36.0 g/dL   RDW 32.4 (H) 40.1 - 02.7 %   Platelets 418 (H) 150 - 400 K/uL   nRBC 0.0 0.0 - 0.2 %    Comment: Performed at [facility], [address] [place], [place] [address]  Comprehensive metabolic panel     Status: Abnormal   Collection Time: 04/12/23  8:55 AM  Result Value Ref Range   Sodium 138 135 - 145 mmol/L   Potassium 3.9 3.5 - 5.1 mmol/L   Chloride 105 98 - 111 mmol/L   CO2 26 22 - 32 mmol/L   Glucose, Bld 90 70 - 99 mg/dL    Comment: Glucose reference range applies only to samples taken after fasting for at least 8 hours.   BUN 13 6 - 20 mg/dL   Creatinine, Ser 4.40 0.44 - 1.00 mg/dL   Calcium 8.2 (L) 8.9 - 10.3 mg/dL   Total Protein 6.0 (L) 6.5 - 8.1 g/dL   Albumin 3.0 (L) 3.5 - 5.0 g/dL   AST 7 (L) 15 - 41  U/L   ALT 9 0 - 44 U/L   Alkaline Phosphatase 38 38 - 126 U/L   Total Bilirubin 0.4 0.3 - 1.2 mg/dL   GFR, Estimated >34 >74 mL/min    Comment: (NOTE) Calculated using the CKD-EPI Creatinine Equation (2021)    Anion gap 7 5 - 15    Comment: Performed at [facility], [address] [place], [place] [address]  CBC     Status: Abnormal   Collection Time: 04/12/23  8:58 AM  Result Value Ref Range   WBC 6.4 4.0 - 10.5 K/uL   RBC 3.23 (L) 3.87 - 5.11 MIL/uL   Hemoglobin 9.1 (L) 12.0 - 15.0 g/dL   HCT 38.7 (  L) 36.0 - 46.0 %   MCV 87.0 80.0 - 100.0 fL   MCH 28.2 26.0 - 34.0 pg   MCHC 32.4 30.0 - 36.0 g/dL   RDW 16.1 09.6 - 04.5 %   Platelets 269 150 - 400 K/uL   nRBC 0.0 0.0 - 0.2 %    Comment: Performed at [facility], [address] [place], [place] [address]  Glucose, capillary     Status: None   Collection Time: 04/12/23 11:47 AM  Result Value Ref Range   Glucose-Capillary 94 70 - 99 mg/dL    Comment: Glucose reference range applies only to samples taken after fasting for at least 8 hours.  Urinalysis, Routine w reflex microscopic -Urine, Clean Catch     Status: Abnormal   Collection Time: 04/12/23 11:52 AM  Result Value Ref Range   Color, Urine YELLOW (A) YELLOW   APPearance CLOUDY (A) CLEAR   Specific Gravity, Urine 1.013 1.005 - 1.030   pH 5.0 5.0 - 8.0   Glucose, UA NEGATIVE NEGATIVE mg/dL   Hgb urine dipstick SMALL (A) NEGATIVE   Bilirubin Urine NEGATIVE NEGATIVE   Ketones, ur NEGATIVE NEGATIVE mg/dL   Protein, ur NEGATIVE NEGATIVE mg/dL   Nitrite NEGATIVE NEGATIVE   Leukocytes,Ua NEGATIVE NEGATIVE   RBC / HPF 0-5 0 - 5 RBC/hpf   WBC, UA 0-5 0 - 5 WBC/hpf   Bacteria, UA MANY (A) NONE SEEN   Squamous Epithelial / HPF 11-20 0 - 5 /HPF   Mucus PRESENT     Comment: Performed at [facility], [address] [place], [place] [address]  Glucose, capillary     Status: Abnormal   Collection Time: 04/12/23  3:56 PM  Result Value Ref Range    Glucose-Capillary 103 (H) 70 - 99 mg/dL    Comment: Glucose reference range applies only to samples taken after fasting for at least 8 hours.       Assessment & Plan:   Problem List Items Addressed This Visit       Active Problems   Narcotic dependence [facility])    Patient is currently in remission under MAT and is well managed.  Will defer to those providers for further changes to her POC.       Other Visit Diagnoses     Drug-induced constipation    -  Primary   Likely due to the patient's Suboxone therapy Patient counseled to continue bowel regimen as directed at ED.   Dysphonia       Patient has been set up for palate surgery via [facility].  Will defer to them for further changes.       Return in about 2 weeks (around 04/30/2023) for F/U.   Total time spent: 20 minutes  STORM PIETRAS, FNP  04/16/2023   This document may have been prepared by [facility] Voice Recognition software and as such may include unintentional dictation errors.

## 2023-05-24 ENCOUNTER — Encounter: Payer: Self-pay | Admitting: Family

## 2023-05-24 DIAGNOSIS — K5903 Drug induced constipation: Secondary | ICD-10-CM

## 2023-05-24 HISTORY — DX: Drug induced constipation: K59.03

## 2023-05-24 NOTE — Assessment & Plan Note (Signed)
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  Will defer to those providers for further changes to her POC.

## 2023-05-24 NOTE — Assessment & Plan Note (Signed)
I have asked that patient continue to take medications for this, as it is likely to recur, given her use of suboxone.  If stool softeners are not effective, to use miralax.  If still not effective, asked pt to let me know.   Recheck at follow up.

## 2023-05-28 ENCOUNTER — Ambulatory Visit: Payer: Medicaid Other | Admitting: Family

## 2023-08-12 ENCOUNTER — Ambulatory Visit: Payer: Medicaid Other | Admitting: Family

## 2023-09-09 ENCOUNTER — Ambulatory Visit: Payer: Medicaid Other | Admitting: Family

## 2023-09-09 ENCOUNTER — Encounter: Payer: Self-pay | Admitting: Family

## 2023-09-09 VITALS — BP 136/86 | HR 92 | Ht 61.0 in | Wt 203.0 lb

## 2023-09-09 DIAGNOSIS — M3130 Wegener's granulomatosis without renal involvement: Secondary | ICD-10-CM | POA: Diagnosis not present

## 2023-09-09 DIAGNOSIS — Q385 Congenital malformations of palate, not elsewhere classified: Secondary | ICD-10-CM

## 2023-09-09 DIAGNOSIS — R7303 Prediabetes: Secondary | ICD-10-CM | POA: Diagnosis not present

## 2023-09-09 DIAGNOSIS — F419 Anxiety disorder, unspecified: Secondary | ICD-10-CM | POA: Diagnosis not present

## 2023-09-09 DIAGNOSIS — D849 Immunodeficiency, unspecified: Secondary | ICD-10-CM

## 2023-09-09 DIAGNOSIS — F32A Depression, unspecified: Secondary | ICD-10-CM

## 2023-09-09 DIAGNOSIS — Z0130 Encounter for examination of blood pressure without abnormal findings: Secondary | ICD-10-CM

## 2023-09-09 DIAGNOSIS — R490 Dysphonia: Secondary | ICD-10-CM | POA: Diagnosis not present

## 2023-10-07 ENCOUNTER — Ambulatory Visit: Payer: Medicaid Other | Admitting: Family

## 2023-10-09 ENCOUNTER — Ambulatory Visit: Admitting: Family

## 2023-10-12 ENCOUNTER — Ambulatory Visit: Admitting: Family

## 2023-10-12 ENCOUNTER — Encounter: Payer: Self-pay | Admitting: Family

## 2023-10-12 VITALS — BP 130/90 | HR 87 | Ht 61.0 in | Wt 200.0 lb

## 2023-10-12 DIAGNOSIS — F909 Attention-deficit hyperactivity disorder, unspecified type: Secondary | ICD-10-CM

## 2023-10-12 DIAGNOSIS — I10 Essential (primary) hypertension: Secondary | ICD-10-CM

## 2023-10-12 DIAGNOSIS — E538 Deficiency of other specified B group vitamins: Secondary | ICD-10-CM | POA: Diagnosis not present

## 2023-10-12 DIAGNOSIS — R7303 Prediabetes: Secondary | ICD-10-CM

## 2023-10-12 DIAGNOSIS — R5383 Other fatigue: Secondary | ICD-10-CM

## 2023-10-12 DIAGNOSIS — E782 Mixed hyperlipidemia: Secondary | ICD-10-CM

## 2023-10-12 DIAGNOSIS — E039 Hypothyroidism, unspecified: Secondary | ICD-10-CM

## 2023-10-12 DIAGNOSIS — E559 Vitamin D deficiency, unspecified: Secondary | ICD-10-CM

## 2023-10-12 MED ORDER — ATOMOXETINE HCL 40 MG PO CAPS: 40.0000 mg | ORAL_CAPSULE | Freq: Every day | ORAL | 1 refills | Status: DC

## 2023-10-13 LAB — CBC WITH DIFFERENTIAL/PLATELET
Basophils Absolute: 0 10*3/uL (ref 0.0–0.2)
Basos: 0 %
EOS (ABSOLUTE): 0.2 10*3/uL (ref 0.0–0.4)
Eos: 2 %
Hematocrit: 31.7 % — ABNORMAL LOW (ref 34.0–46.6)
Hemoglobin: 10.4 g/dL — ABNORMAL LOW (ref 11.1–15.9)
Immature Grans (Abs): 0 10*3/uL (ref 0.0–0.1)
Immature Granulocytes: 0 %
Lymphocytes Absolute: 1.8 10*3/uL (ref 0.7–3.1)
Lymphs: 23 %
MCH: 28.2 pg (ref 26.6–33.0)
MCHC: 32.8 g/dL (ref 31.5–35.7)
MCV: 86 fL (ref 79–97)
Monocytes Absolute: 0.5 10*3/uL (ref 0.1–0.9)
Monocytes: 6 %
Neutrophils Absolute: 5.3 10*3/uL (ref 1.4–7.0)
Neutrophils: 69 %
Platelets: 334 10*3/uL (ref 150–450)
RBC: 3.69 x10E6/uL — ABNORMAL LOW (ref 3.77–5.28)
RDW: 13.4 % (ref 11.7–15.4)
WBC: 7.9 10*3/uL (ref 3.4–10.8)

## 2023-10-13 LAB — CMP14+EGFR
ALT: 8 IU/L (ref 0–32)
AST: 6 IU/L (ref 0–40)
Albumin: 3.9 g/dL (ref 3.9–4.9)
Alkaline Phosphatase: 62 IU/L (ref 44–121)
BUN/Creatinine Ratio: 23 (ref 9–23)
BUN: 16 mg/dL (ref 6–20)
Bilirubin Total: <0.2 mg/dL (ref 0.0–1.2)
CO2: 26 mmol/L (ref 20–29)
Calcium: 9 mg/dL (ref 8.7–10.2)
Chloride: 107 mmol/L — ABNORMAL HIGH (ref 96–106)
Creatinine, Ser: 0.7 mg/dL (ref 0.57–1.00)
Globulin, Total: 2.5 g/dL (ref 1.5–4.5)
Glucose: 87 mg/dL (ref 70–99)
Potassium: 4.1 mmol/L (ref 3.5–5.2)
Sodium: 141 mmol/L (ref 134–144)
Total Protein: 6.4 g/dL (ref 6.0–8.5)
eGFR: 113 mL/min/{1.73_m2} (ref >59)

## 2023-10-13 LAB — HEMOGLOBIN A1C
Est. average glucose Bld gHb Est-mCnc: 103 mg/dL
Hgb A1c MFr Bld: 5.2 % (ref 4.8–5.6)

## 2023-10-13 LAB — LIPID PANEL
Chol/HDL Ratio: 3.6 ratio (ref 0.0–4.4)
Cholesterol, Total: 119 mg/dL (ref 100–199)
HDL: 33 mg/dL — ABNORMAL LOW (ref >39)
LDL Chol Calc (NIH): 62 mg/dL (ref 0–99)
Triglycerides: 135 mg/dL (ref 0–149)
VLDL Cholesterol Cal: 24 mg/dL (ref 5–40)

## 2023-10-13 LAB — TSH: TSH: 1.25 u[IU]/mL (ref 0.450–4.500)

## 2023-10-13 LAB — VITAMIN B12: Vitamin B-12: 501 pg/mL (ref 232–1245)

## 2023-10-13 LAB — VITAMIN D 25 HYDROXY (VIT D DEFICIENCY, FRACTURES): Vit D, 25-Hydroxy: 15 ng/mL — ABNORMAL LOW (ref 30.0–100.0)

## 2023-10-15 ENCOUNTER — Other Ambulatory Visit: Payer: Self-pay

## 2023-10-15 MED ORDER — VITAMIN D (ERGOCALCIFEROL) 1.25 MG (50000 UNIT) PO CAPS: 50000.0000 [IU] | ORAL_CAPSULE | ORAL | 3 refills | Status: DC

## 2023-10-26 DIAGNOSIS — R490 Dysphonia: Secondary | ICD-10-CM | POA: Insufficient documentation

## 2023-10-26 DIAGNOSIS — L929 Granulomatous disorder of the skin and subcutaneous tissue, unspecified: Secondary | ICD-10-CM | POA: Insufficient documentation

## 2023-10-26 NOTE — Progress Notes (Signed)
 Established Patient Office Visit  Subjective:  Patient ID: Regina Mays, female    DOB: 03-26-1986  Age: 38 y.o. MRN: [id number]  Chief Complaint  Patient presents with   Follow-up    Discuss medications    Patient is here today for follow up.  She has been doing very well with the wegovy , does not have any side effects from it.   She has her surgery scheduled at [facility] coming up. Has been doing well with her infusions from Rheumatology, not having a lot of side effects.   She is still having significant trouble with her voice, understanding her is quite difficult. She also has been having a lot of trouble with her anxiety and depression, as she has been trying to ready herself for the new normal once she has her procedure.     No other concerns at this time.   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  In rehab   Left ankle pain    Left ankle swelling    Long term current use of systemic steroids 06/09/2022   Long-term use of immunosuppressant medication 06/09/2022   Neuropathic pain of left lower extremity 04/16/2023   Open wound of elbow 04/16/2023   Panic attacks    Recurrent major depressive disorder, in remission (HCC) 06/20/2015   Sepsis due to undetermined organism (HCC) 04/11/2023   Wears dentures    partial upper    Past Surgical History:  Procedure Laterality  Date   CYSTOSCOPY N/A 07/30/2017   Procedure: CYSTOSCOPY;  Surgeon: Arloa Lamar SQUIBB, MD;  Location: [facility] ORS;  Service: Gynecology;  Laterality: N/A;   LAPAROSCOPIC HYSTERECTOMY Bilateral 07/30/2017   Procedure: HYSTERECTOMY TOTAL LAPAROSCOPIC BILATERAL SALPINGECTOMY;  Surgeon: Arloa Lamar SQUIBB, MD;  Location: [facility];  Service: Gynecology;  Laterality: Bilateral;   LEG SURGERY Left 2016   ankle was broken. 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;  Surgeon: Herminio Miu, MD;  Location: [facility];  Service: ENT;  Laterality: N/A;   TUBAL LIGATION  2007   WISDOM TOOTH EXTRACTION      Social History   Socioeconomic History   Marital status: Married    Spouse name: Not on file   Number of children: Not on file   Years of education: Not on file   Highest education level: Not on file  Occupational History   Not on file  Tobacco Use   Smoking status: Every Day    Current packs/day: 0.50    Average packs/day: 0.5 packs/day for 19.0 years (9.5 ttl pk-yrs)    Types: Cigarettes   Smokeless tobacco: Never   Tobacco comments:    Started smoking age 89  Vaping Use   Vaping status: Never Used  Substance and Sexual Activity   Alcohol use: No   Drug use: No    Comment: suboxone     Sexual activity: Yes    Birth control/protection: None  Other Topics Concern   Not on file  Social History Narrative   Not on file   Social Drivers of Health   Financial Resource Strain: Not on file  Food Insecurity: No Food Insecurity (04/11/2023)   Hunger Vital Sign    Worried About Running Out of Food in the Last Year: Never true    Ran Out of Food in the Last Year: Never true  Transportation Needs: No Transportation Needs (04/11/2023)   PRAPARE - Administrator, Civil Service (Medical): No    Lack of Transportation (Non-Medical): No  Physical Activity: Not on file  Stress: Not on  file  Social Connections: Not on file  Intimate Partner Violence: Not At Risk (04/11/2023)   Humiliation, Afraid, Rape, and Kick questionnaire    Fear of Current or Ex-Partner: No    Emotionally Abused: No    Physically Abused: No    Sexually Abused: No    Family History  Problem Relation Age of Onset   Hypertension Mother    Cancer Mother    Hypertension Father    Diabetes Maternal Grandmother     Allergies  Allergen Reactions   Penicillins Diarrhea and Nausea And Vomiting    Has patient had a PCN reaction causing immediate rash, facial/tongue/throat swelling, SOB or lightheadedness with hypotension: yes Has patient had a PCN reaction causing severe rash involving mucus membranes or skin necrosis: no Has patient had a PCN reaction that required hospitalization: no Has patient had a PCN reaction occurring within the last 10 years: yes If all of the above answers are NO, then may proceed with Cephalosporin use.     SWELLING OF MOUTH/THROAT AND  Diarrhea, n & v    Hydrocodone  Hives and Rash   Toradol  [Ketorolac  Tromethamine ] Rash   Tramadol  Hives and Rash    Review of Systems  Constitutional:  Positive for malaise/fatigue.  HENT:         Voice is difficult to understand.    Cardiovascular:  Positive for claudication and leg swelling.  Psychiatric/Behavioral:  Positive for depression. The patient is nervous/anxious and has insomnia.   All other systems reviewed and are negative.      Objective:   BP 136/86   Pulse 92   Ht 5' 1 (1.549 m)   Wt 203 lb (92.1 kg)   LMP 06/04/2017 (Exact Date)   SpO2 97%   BMI 38.36 kg/m   Vitals:   09/09/23 1414  BP: 136/86  Pulse: 92  Height: 5' 1 (1.549 m)  Weight: 203 lb (92.1 kg)  SpO2: 97%  BMI (Calculated): 38.38    Physical Exam Vitals and nursing note reviewed.  Constitutional:      Appearance: Normal appearance. She is normal weight.  HENT:     Head: Normocephalic.     Mouth/Throat:     Dentition: Abnormal  dentition. Dental tenderness and dental caries present.     Comments: Partial palate, missing the back portion of  her palate.  Eyes:     Extraocular Movements: Extraocular movements intact.     Conjunctiva/sclera: Conjunctivae normal.     Pupils: Pupils are equal, round, and reactive to light.  Cardiovascular:     Rate and Rhythm: Normal rate.  Pulmonary:     Effort: Pulmonary effort is normal.  Musculoskeletal:  Cervical back: Normal range of motion and neck supple.  Neurological:     General: No focal deficit present.     Mental Status: She is alert and oriented to person, place, and time. Mental status is at baseline.  Psychiatric:        Attention and Perception: She is inattentive.        Mood and Affect: Mood is anxious.        Speech: Speech is rapid and pressured.        Behavior: Behavior is hyperactive. Behavior is cooperative.        Thought Content: Thought content normal.        Cognition and Memory: Cognition and memory normal.        Judgment: Judgment normal.      No results found for any visits on 09/09/23.  Recent Results (from the past 2160 hours)  Lipid panel     Status: Abnormal   Collection Time: 10/12/23  3:29 PM  Result Value Ref Range   Cholesterol, Total 119 100 - 199 mg/dL   Triglycerides 864 0 - 149 mg/dL   HDL 33 (L) >60 mg/dL   VLDL Cholesterol Cal 24 5 - 40 mg/dL   LDL Chol Calc (NIH) 62 0 - 99 mg/dL   Chol/HDL Ratio 3.6 0.0 - 4.4 ratio    Comment:                                   T. Chol/HDL Ratio                                             Men  Women                               1/2 Avg.Risk  3.4    3.3                                   Avg.Risk  5.0    4.4                                2X Avg.Risk  9.6    7.1                                3X Avg.Risk 23.4   11.0   VITAMIN D  25 Hydroxy (Vit-D Deficiency, Fractures)     Status: Abnormal   Collection Time: 10/12/23  3:29 PM  Result Value Ref Range   Vit D, 25-Hydroxy 15.0 (L)  30.0 - 100.0 ng/mL    Comment: Vitamin D  deficiency has been defined by the Institute of Medicine and an Endocrine Society practice guideline as a level of serum 25-OH vitamin D  less than 20 ng/mL (1,2). The Endocrine Society went on to further define vitamin D  insufficiency as a level between 21 and 29 ng/mL (2). 1. IOM (Institute of Medicine). 2010. Dietary reference    intakes for calcium and D. [place]: The    [employer]. 2. Holick MF, Binkley [place], Bischoff-Ferrari HA, et al.  Evaluation, treatment, and prevention of vitamin D     deficiency: an Endocrine Society clinical practice    guideline. JCEM. 2011 Jul; 96(7):1911-30.   CMP14+EGFR     Status: Abnormal   Collection Time: 10/12/23  3:29 PM  Result Value Ref Range   Glucose 87 70 - 99 mg/dL   BUN 16 6 - 20 mg/dL   Creatinine, Ser 9.29 0.57 - 1.00 mg/dL   eGFR 886 >40 fO/fpw/8.26   BUN/Creatinine Ratio 23 9 - 23   Sodium 141 134 - 144 mmol/L   Potassium 4.1 3.5 - 5.2 mmol/L   Chloride 107 (H) 96 - 106 mmol/L   CO2 26 20 - 29 mmol/L   Calcium 9.0 8.7 - 10.2 mg/dL   Total Protein 6.4 6.0 - 8.5 g/dL   Albumin 3.9 3.9 - 4.9 g/dL   Globulin, Total 2.5 1.5 - 4.5 g/dL   Bilirubin Total <9.7 0.0 - 1.2 mg/dL   Alkaline Phosphatase 62 44 - 121 IU/L   AST 6 0 - 40 IU/L   ALT 8 0 - 32 IU/L  TSH     Status: None   Collection Time: 10/12/23  3:29 PM  Result Value Ref Range   TSH 1.250 0.450 - 4.500 uIU/mL  Hemoglobin A1c     Status: None   Collection Time: 10/12/23  3:29 PM  Result Value Ref Range   Hgb A1c MFr Bld 5.2 4.8 - 5.6 %    Comment:          Prediabetes: 5.7 - 6.4          Diabetes: >6.4          Glycemic control for adults with diabetes: <7.0    Est. average glucose Bld gHb Est-mCnc 103 mg/dL  Vitamin B12     Status: None   Collection Time: 10/12/23  3:29 PM  Result Value Ref Range   Vitamin B-12 501 232 - 1,245 pg/mL  CBC with Diff     Status: Abnormal   Collection Time: 10/12/23  3:29 PM   Result Value Ref Range   WBC 7.9 3.4 - 10.8 x10E3/uL   RBC 3.69 (L) 3.77 - 5.28 x10E6/uL   Hemoglobin 10.4 (L) 11.1 - 15.9 g/dL   Hematocrit 68.2 (L) 65.9 - 46.6 %   MCV 86 79 - 97 fL   MCH 28.2 26.6 - 33.0 pg   MCHC 32.8 31.5 - 35.7 g/dL   RDW 86.5 88.2 - 84.5 %   Platelets 334 150 - 450 x10E3/uL   Neutrophils 69 Not Estab. %   Lymphs 23 Not Estab. %   Monocytes 6 Not Estab. %   Eos 2 Not Estab. %   Basos 0 Not Estab. %   Neutrophils Absolute 5.3 1.4 - 7.0 x10E3/uL   Lymphocytes Absolute 1.8 0.7 - 3.1 x10E3/uL   Monocytes Absolute 0.5 0.1 - 0.9 x10E3/uL   EOS (ABSOLUTE) 0.2 0.0 - 0.4 x10E3/uL   Basophils Absolute 0.0 0.0 - 0.2 x10E3/uL   Immature Granulocytes 0 Not Estab. %   Immature Grans (Abs) 0.0 0.0 - 0.1 x10E3/uL       Assessment & Plan:   Problem List Items Addressed This Visit       Other   Anxiety and depression (Chronic)   Patient doing well with therapy.   Will continue current treatment.   Reassess at follow up.      Relevant Medications   buPROPion  (WELLBUTRIN  XL) 300 MG 24 hr tablet   Immunosuppression (  HCC)   Patient stable.  Well controlled with current therapy.   Continue current meds.        Dysphonia - Primary   Patient continues to have issues with this, hopefully will get some improvement with her upcoming procedure.  Do not expect that this will be a perfect solution, but the procedure should allow her to speak more clearly.       Granulomatosis   Patient is seen by Rheumatology, who manage this condition.  She is well controlled with current therapy.   Will defer to them for further changes to plan of care.       Prediabetes   Patient did well with the Wegovy  and will send RX for her.  Will continue her diet and exercise changes to help with her weight loss.        Other Visit Diagnoses       Abnormality of palate       Patient to have surgery soon.  Will recheck at her follow up to re-evaluate.       Return in  about 1 month (around 10/07/2023) for F/U.   Total time spent: 20 minutes  SAMANTHIA HOWLAND, FNP  09/09/2023   This document may have been prepared by [facility] Voice Recognition software and as such may include unintentional dictation errors.

## 2023-11-01 ENCOUNTER — Encounter: Payer: Self-pay | Admitting: Family

## 2023-11-01 DIAGNOSIS — R7303 Prediabetes: Secondary | ICD-10-CM | POA: Insufficient documentation

## 2023-11-01 NOTE — Assessment & Plan Note (Addendum)
 Patient continues to have issues with this, hopefully will get some improvement with her upcoming procedure.  Do not expect that this will be a perfect solution, but the procedure should allow her to speak more clearly.

## 2023-11-01 NOTE — Assessment & Plan Note (Signed)
 Patient did well with the [facility] and will send RX for her.  Will continue her diet and exercise changes to help with her weight loss.

## 2023-11-01 NOTE — Assessment & Plan Note (Signed)
 Patient doing well with therapy.   Will continue current treatment.   Reassess at follow up.

## 2023-11-01 NOTE — Assessment & Plan Note (Signed)
 Patient stable.  Well controlled with current therapy.   Continue current meds.

## 2023-11-01 NOTE — Assessment & Plan Note (Signed)
Patient is seen by Rheumatology, who manage this condition.  She is well controlled with current therapy.   Will defer to them for further changes to plan of care.

## 2023-11-02 ENCOUNTER — Encounter: Payer: Self-pay | Admitting: Family

## 2023-11-02 ENCOUNTER — Ambulatory Visit: Admitting: Family

## 2023-11-02 VITALS — BP 106/78 | HR 90 | Ht 61.0 in | Wt 194.4 lb

## 2023-11-02 DIAGNOSIS — E66812 Obesity, class 2: Secondary | ICD-10-CM | POA: Diagnosis not present

## 2023-11-02 DIAGNOSIS — Z6836 Body mass index (BMI) 36.0-36.9, adult: Secondary | ICD-10-CM

## 2023-11-02 DIAGNOSIS — F909 Attention-deficit hyperactivity disorder, unspecified type: Secondary | ICD-10-CM | POA: Diagnosis not present

## 2023-11-02 MED ORDER — WEGOVY 0.5 MG/0.5ML ~~LOC~~ SOAJ: 0.5000 mg | SUBCUTANEOUS | 1 refills | Status: DC

## 2023-11-02 MED ORDER — ATOMOXETINE HCL 60 MG PO CAPS: 60.0000 mg | ORAL_CAPSULE | Freq: Every day | ORAL | 0 refills | Status: DC

## 2023-11-02 NOTE — Progress Notes (Signed)
 Established Patient Office Visit  Subjective:  Patient ID: Regina Mays, female    DOB: 11/18/1985  Age: 38 y.o. MRN: [id number]  Chief Complaint  Patient presents with   Follow-up    1 month follow up    Patient is here today for her 1 month follow up.  She has been feeling fairly well since last appointment.   She does have additional concerns to discuss today.  She has been doing well with the Strattera, says that she feels like her attention is better and her grades have improved. Her professor has also seen an improvement. She does think she might need a higher dose, as she has been having it wear off earlier than she would need it to last.   She also has been doing well with the wegovy injections, had some upset stomach, but she says that it is tolerable and she likes the med.   Labs are not due today. She needs refills.   I have reviewed her active problem list, medication list, allergies, notes from last encounter, lab results for her appointment today.      No other concerns at this time.   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  In rehab   Left ankle pain    Left ankle swelling    Long term current use of systemic steroids 06/09/2022   Long-term use of immunosuppressant medication 06/09/2022   Neuropathic pain of left lower extremity 04/16/2023   Open wound of elbow  04/16/2023   Panic attacks    Recurrent major depressive disorder, in remission (HCC) 06/20/2015   Sepsis due to undetermined organism (HCC) 04/11/2023   Wears dentures    partial upper    Past Surgical History:  Procedure Laterality Date   CYSTOSCOPY N/A 07/30/2017   Procedure: CYSTOSCOPY;  Surgeon: Nadara Mustard, MD;  Location: [facility];  Service: Gynecology;  Laterality: N/A;   LAPAROSCOPIC HYSTERECTOMY Bilateral 07/30/2017   Procedure: HYSTERECTOMY TOTAL LAPAROSCOPIC BILATERAL SALPINGECTOMY;  Surgeon: Nadara Mustard, MD;  Location: [facility];  Service: Gynecology;  Laterality: Bilateral;   LEG SURGERY Left 2016   ankle was broken. 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;  Surgeon: Linus Salmons, MD;  Location: [facility];  Service: ENT;  Laterality: N/A;   TUBAL LIGATION  2007   WISDOM TOOTH EXTRACTION      Social History   Socioeconomic History   Marital status: Married    Spouse name: Not on file   Number of children: Not on file   Years of education: Not on file   Highest education level: Not on file  Occupational History   Not on file  Tobacco Use   Smoking status: Every Day  Current packs/day: 0.50    Average packs/day: 0.5 packs/day for 19.0 years (9.5 ttl pk-yrs)    Types: Cigarettes   Smokeless tobacco: Never   Tobacco comments:    Started smoking age 7  Vaping Use   Vaping status: Never Used  Substance and Sexual Activity   Alcohol use: No   Drug use: No    Comment: suboxone    Sexual activity: Yes    Birth control/protection: None  Other Topics Concern   Not on file  Social History Narrative   Not on file   Social Drivers of Health   Financial Resource Strain: Not on file  Food Insecurity: No Food Insecurity (04/11/2023)   Hunger Vital Sign    Worried About Running Out of Food in the Last Year: Never true    Ran Out of Food in the  Last Year: Never true  Transportation Needs: No Transportation Needs (04/11/2023)   PRAPARE - Administrator, Civil Service (Medical): No    Lack of Transportation (Non-Medical): No  Physical Activity: Not on file  Stress: Not on file  Social Connections: Not on file  Intimate Partner Violence: Not At Risk (04/11/2023)   Humiliation, Afraid, Rape, and Kick questionnaire    Fear of Current or Ex-Partner: No    Emotionally Abused: No    Physically Abused: No    Sexually Abused: No    Family History  Problem Relation Age of Onset   Hypertension Mother    Cancer Mother    Hypertension Father    Diabetes Maternal Grandmother     Allergies  Allergen Reactions   Penicillins Diarrhea and Nausea And Vomiting    Has patient had a PCN reaction causing immediate rash, facial/tongue/throat swelling, SOB or lightheadedness with hypotension: yes Has patient had a PCN reaction causing severe rash involving mucus membranes or skin necrosis: no Has patient had a PCN reaction that required hospitalization: no Has patient had a PCN reaction occurring within the last 10 years: yes If all of the above answers are "NO", then may proceed with Cephalosporin use.     SWELLING OF MOUTH/THROAT AND  Diarrhea, n & v    Hydrocodone Hives and Rash   Toradol [Ketorolac Tromethamine] Rash   Tramadol Hives and Rash    Review of Systems  All other systems reviewed and are negative.      Objective:   BP 106/78   Pulse 90   Ht 5\' 1"  (1.549 m)   Wt 194 lb 6.4 oz (88.2 kg)   LMP 06/04/2017 (Exact Date)   SpO2 98%   BMI 36.73 kg/m   Vitals:   11/02/23 1318  BP: 106/78  Pulse: 90  Height: 5\' 1"  (1.549 m)  Weight: 194 lb 6.4 oz (88.2 kg)  SpO2: 98%  BMI (Calculated): 36.75    Physical Exam Vitals and nursing note reviewed.  Constitutional:      Appearance: Normal appearance. She is normal weight.  HENT:     Head: Normocephalic and atraumatic.     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 Eyes:     Extraocular Movements: Extraocular movements intact.     Conjunctiva/sclera: Conjunctivae normal.     Pupils: Pupils are equal, round, and reactive to light.  Cardiovascular:     Rate and Rhythm: Normal rate and regular rhythm.  Pulmonary:     Effort: Pulmonary effort is normal.  Musculoskeletal:        General:  Normal range of motion.  Neurological:     General: No focal deficit present.     Mental Status: She is alert and oriented to person, place, and time. Mental status is at baseline.  Psychiatric:        Mood and Affect: Mood normal.        Behavior: Behavior normal.        Thought Content: Thought content normal.        Judgment: Judgment normal.      No results found for any visits on 11/02/23.  Recent Results (from the past 2160 hours)  Lipid panel     Status: Abnormal   Collection Time: 10/12/23  3:29 PM  Result Value Ref Range   Cholesterol, Total 119 100 - 199 mg/dL   Triglycerides 409 0 - 149 mg/dL   HDL 33 (L) >81 mg/dL   VLDL Cholesterol Cal 24 5 - 40 mg/dL   LDL Chol Calc (NIH) 62 0 - 99 mg/dL   Chol/HDL Ratio 3.6 0.0 - 4.4 ratio    Comment:                                   T. Chol/HDL Ratio                                             Men  Women                               1/2 Avg.Risk  3.4    3.3                                   Avg.Risk  5.0    4.4                                2X Avg.Risk  9.6    7.1                                3X Avg.Risk 23.4   11.0   VITAMIN D 25 Hydroxy (Vit-D Deficiency, Fractures)     Status: Abnormal   Collection Time: 10/12/23  3:29 PM  Result Value Ref Range   Vit D, 25-Hydroxy 15.0 (L) 30.0 - 100.0 ng/mL    Comment: Vitamin D deficiency has been defined by the Institute of Medicine and an Endocrine Society practice guideline as a level of serum 25-OH vitamin D less than 20 ng/mL (1,2). The Endocrine Society went on to further define vitamin D insufficiency as a level  between 21 and 29 ng/mL (2). 1. IOM (Institute of Medicine). 2010. Dietary reference    intakes for calcium and D. [place]: The    [employer]. 2. Holick MF, Binkley [place], Bischoff-Ferrari HA, et al.    Evaluation, treatment, and prevention of vitamin D    deficiency: an Endocrine Society clinical practice    guideline. JCEM. 2011 Jul; 96(7):1911-30.   CMP14+EGFR     Status: Abnormal   Collection Time: 10/12/23  3:29 PM  Result Value Ref Range  Glucose 87 70 - 99 mg/dL   BUN 16 6 - 20 mg/dL   Creatinine, Ser 1.61 0.57 - 1.00 mg/dL   eGFR 096 >04 VW/UJW/1.19   BUN/Creatinine Ratio 23 9 - 23   Sodium 141 134 - 144 mmol/L   Potassium 4.1 3.5 - 5.2 mmol/L   Chloride 107 (H) 96 - 106 mmol/L   CO2 26 20 - 29 mmol/L   Calcium 9.0 8.7 - 10.2 mg/dL   Total Protein 6.4 6.0 - 8.5 g/dL   Albumin 3.9 3.9 - 4.9 g/dL   Globulin, Total 2.5 1.5 - 4.5 g/dL   Bilirubin Total <1.4 0.0 - 1.2 mg/dL   Alkaline Phosphatase 62 44 - 121 IU/L   AST 6 0 - 40 IU/L   ALT 8 0 - 32 IU/L  TSH     Status: None   Collection Time: 10/12/23  3:29 PM  Result Value Ref Range   TSH 1.250 0.450 - 4.500 uIU/mL  Hemoglobin A1c     Status: None   Collection Time: 10/12/23  3:29 PM  Result Value Ref Range   Hgb A1c MFr Bld 5.2 4.8 - 5.6 %    Comment:          Prediabetes: 5.7 - 6.4          Diabetes: >6.4          Glycemic control for adults with diabetes: <7.0    Est. average glucose Bld gHb Est-mCnc 103 mg/dL  Vitamin N82     Status: None   Collection Time: 10/12/23  3:29 PM  Result Value Ref Range   Vitamin B-12 501 232 - 1,245 pg/mL  CBC with Diff     Status: Abnormal   Collection Time: 10/12/23  3:29 PM  Result Value Ref Range   WBC 7.9 3.4 - 10.8 x10E3/uL   RBC 3.69 (L) 3.77 - 5.28 x10E6/uL   Hemoglobin 10.4 (L) 11.1 - 15.9 g/dL   Hematocrit 95.6 (L) 21.3 - 46.6 %   MCV 86 79 - 97 fL   MCH 28.2 26.6 - 33.0 pg   MCHC 32.8 31.5 - 35.7 g/dL   RDW 08.6 57.8 - 46.9 %   Platelets 334  150 - 450 x10E3/uL   Neutrophils 69 Not Estab. %   Lymphs 23 Not Estab. %   Monocytes 6 Not Estab. %   Eos 2 Not Estab. %   Basos 0 Not Estab. %   Neutrophils Absolute 5.3 1.4 - 7.0 x10E3/uL   Lymphocytes Absolute 1.8 0.7 - 3.1 x10E3/uL   Monocytes Absolute 0.5 0.1 - 0.9 x10E3/uL   EOS (ABSOLUTE) 0.2 0.0 - 0.4 x10E3/uL   Basophils Absolute 0.0 0.0 - 0.2 x10E3/uL   Immature Granulocytes 0 Not Estab. %   Immature Grans (Abs) 0.0 0.0 - 0.1 x10E3/uL       Assessment & Plan:   Problem List Items Addressed This Visit       Other   Adult ADHD - Primary (Chronic)   Increase Strattera dose to 60mg  daily. Will reassess at follow up       Class 2 severe obesity due to excess calories with serious comorbidity and body mass index (BMI) of 36.0 to 36.9 in adult [facility])   Continue Wegovy. RX sent to pharmacy for pt.   Will reassess at follow up.      Relevant Medications   atomoxetine (STRATTERA) 60 MG capsule   Semaglutide-Weight Management (WEGOVY) 0.5 MG/0.5ML SOAJ    Return in  about 1 month (around 12/02/2023) for F/U.   Total time spent: 20 minutes  MYKERIA GARMAN, FNP  11/02/2023   This document may have been prepared by [facility] Voice Recognition software and as such may include unintentional dictation errors.

## 2023-11-02 NOTE — Assessment & Plan Note (Signed)
 Continue [id number]. RX sent to pharmacy for pt.   Will reassess at follow up.

## 2023-11-02 NOTE — Assessment & Plan Note (Signed)
 Increase Strattera dose to 60mg  daily. Will reassess at follow up

## 2023-11-13 ENCOUNTER — Ambulatory Visit: Admitting: Family

## 2023-11-29 ENCOUNTER — Other Ambulatory Visit: Payer: Self-pay | Admitting: Family

## 2023-12-02 ENCOUNTER — Ambulatory Visit: Admitting: Family

## 2023-12-09 ENCOUNTER — Encounter: Payer: Self-pay | Admitting: Family

## 2023-12-09 ENCOUNTER — Ambulatory Visit: Admitting: Family

## 2023-12-09 VITALS — BP 104/60 | HR 88 | Ht 61.0 in | Wt 191.4 lb

## 2023-12-09 DIAGNOSIS — F909 Attention-deficit hyperactivity disorder, unspecified type: Secondary | ICD-10-CM

## 2023-12-09 DIAGNOSIS — E66812 Obesity, class 2: Secondary | ICD-10-CM | POA: Diagnosis not present

## 2023-12-09 DIAGNOSIS — Z6836 Body mass index (BMI) 36.0-36.9, adult: Secondary | ICD-10-CM

## 2023-12-09 DIAGNOSIS — Z0130 Encounter for examination of blood pressure without abnormal findings: Secondary | ICD-10-CM

## 2023-12-09 DIAGNOSIS — Z1341 Encounter for autism screening: Secondary | ICD-10-CM | POA: Diagnosis not present

## 2023-12-09 MED ORDER — WEGOVY 1 MG/0.5ML ~~LOC~~ SOAJ: 1.0000 mg | SUBCUTANEOUS | 1 refills | Status: DC

## 2023-12-09 MED ORDER — LISDEXAMFETAMINE DIMESYLATE 40 MG PO CAPS: 40.0000 mg | ORAL_CAPSULE | ORAL | 0 refills | Status: DC

## 2023-12-09 NOTE — Assessment & Plan Note (Signed)
 Increasing Wegovy  dose.  Will recheck at follow up.

## 2023-12-09 NOTE — Assessment & Plan Note (Signed)
 Switching patient to low dose of Vyvanse.  Will recheck at her follow up appointment.

## 2023-12-09 NOTE — Progress Notes (Signed)
 Established Patient Office Visit  Subjective:  Patient ID: Regina Mays, female    DOB: Sep 09, 1985  Age: 38 y.o. MRN: [id number]  Chief Complaint  Patient presents with   Follow-up    1 month follow up    Patient is here today for her 1 month follow up.  She has been feeling fairly well since last appointment.   She does have additional concerns to discuss today.  She says that the Strattera  has not been as effective as she hoped, and that she is willing to try the Vyvanse again.  She originally did not want to do this, but she says that she has not been getting enough results out of the strattera . Asks if we can increase the Wegovy  for her.  Would also like a referral to have an autism evaluation as multiple people have told her that she might be exhibiting signs.   Labs are not due today. She needs refills.   I have reviewed her active problem list, medication list, allergies, notes from last encounter, lab results for her appointment today.     No other concerns at this time.   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  In rehab   Left ankle pain    Left ankle swelling    Long term current use of systemic steroids 06/09/2022   Long-term use of immunosuppressant medication 06/09/2022   Neuropathic pain of left lower extremity 04/16/2023   Open wound of elbow 04/16/2023   Panic  attacks    Recurrent major depressive disorder, in remission (HCC) 06/20/2015   Sepsis due to undetermined organism (HCC) 04/11/2023   Wears dentures    partial upper    Past Surgical History:  Procedure Laterality Date   CYSTOSCOPY N/A 07/30/2017   Procedure: CYSTOSCOPY;  Surgeon: Alben Alma, MD;  Location: [facility];  Service: Gynecology;  Laterality: N/A;   LAPAROSCOPIC HYSTERECTOMY Bilateral 07/30/2017   Procedure: HYSTERECTOMY TOTAL LAPAROSCOPIC BILATERAL SALPINGECTOMY;  Surgeon: Alben Alma, MD;  Location: [facility];  Service: Gynecology;  Laterality: Bilateral;   LEG SURGERY Left 2016   ankle was broken. 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;  Surgeon: Lesly Raspberry, MD;  Location: [facility];  Service: ENT;  Laterality: N/A;   TUBAL LIGATION  2007   WISDOM TOOTH EXTRACTION      Social History   Socioeconomic History   Marital status: Married    Spouse name: Not on file   Number of children: Not on file   Years of education: Not on file   Highest education level: Not on file  Occupational History   Not on file  Tobacco Use   Smoking status: Every Day    Current packs/day: 0.50  Average packs/day: 0.5 packs/day for 19.0 years (9.5 ttl pk-yrs)    Types: Cigarettes   Smokeless tobacco: Never   Tobacco comments:    Started smoking age 88  Vaping Use   Vaping status: Never Used  Substance and Sexual Activity   Alcohol use: No   Drug use: No    Comment: suboxone     Sexual activity: Yes    Birth control/protection: None  Other Topics Concern   Not on file  Social History Narrative   Not on file   Social Drivers of Health   Financial Resource Strain: Not on file  Food Insecurity: No Food Insecurity (04/11/2023)   Hunger Vital Sign    Worried About Running Out of Food in the Last Year: Never true    Ran Out of Food in the Last Year: Never true   Transportation Needs: No Transportation Needs (04/11/2023)   PRAPARE - Administrator, Civil Service (Medical): No    Lack of Transportation (Non-Medical): No  Physical Activity: Not on file  Stress: Not on file  Social Connections: Not on file  Intimate Partner Violence: Not At Risk (04/11/2023)   Humiliation, Afraid, Rape, and Kick questionnaire    Fear of Current or Ex-Partner: No    Emotionally Abused: No    Physically Abused: No    Sexually Abused: No    Family History  Problem Relation Age of Onset   Hypertension Mother    Cancer Mother    Hypertension Father    Diabetes Maternal Grandmother     Allergies  Allergen Reactions   Penicillins Diarrhea and Nausea And Vomiting    Has patient had a PCN reaction causing immediate rash, facial/tongue/throat swelling, SOB or lightheadedness with hypotension: yes Has patient had a PCN reaction causing severe rash involving mucus membranes or skin necrosis: no Has patient had a PCN reaction that required hospitalization: no Has patient had a PCN reaction occurring within the last 10 years: yes If all of the above answers are "NO", then may proceed with Cephalosporin use.     SWELLING OF MOUTH/THROAT AND  Diarrhea, n & v    Hydrocodone  Hives and Rash   Toradol  [Ketorolac  Tromethamine ] Rash   Tramadol  Hives and Rash    Review of Systems  All other systems reviewed and are negative.      Objective:   BP 104/60   Pulse 88   Ht 5\' 1"  (1.549 m)   Wt 191 lb 6.4 oz (86.8 kg)   LMP 06/04/2017 (Exact Date)   SpO2 95%   BMI 36.16 kg/m   Vitals:   12/09/23 1022  BP: 104/60  Pulse: 88  Height: 5\' 1"  (1.549 m)  Weight: 191 lb 6.4 oz (86.8 kg)  SpO2: 95%  BMI (Calculated): 36.18    Physical Exam Vitals and nursing note reviewed.  Constitutional:      Appearance: Normal appearance. She is obese.  HENT:     Head: Normocephalic.  Eyes:     Extraocular Movements: Extraocular movements intact.      Conjunctiva/sclera: Conjunctivae normal.     Pupils: Pupils are equal, round, and reactive to light.  Cardiovascular:     Rate and Rhythm: Normal rate.  Pulmonary:     Effort: Pulmonary effort is normal.  Neurological:     General: No focal deficit present.     Mental Status: She is alert and oriented to person, place, and time. Mental status is at baseline.  Psychiatric:  Mood and Affect: Mood normal.        Behavior: Behavior normal.        Thought Content: Thought content normal.        Judgment: Judgment normal.      No results found for any visits on 12/09/23.  Recent Results (from the past 2160 hours)  Lipid panel     Status: Abnormal   Collection Time: 10/12/23  3:29 PM  Result Value Ref Range   Cholesterol, Total 119 100 - 199 mg/dL   Triglycerides 409 0 - 149 mg/dL   HDL 33 (L) >81 mg/dL   VLDL Cholesterol Cal 24 5 - 40 mg/dL   LDL Chol Calc (NIH) 62 0 - 99 mg/dL   Chol/HDL Ratio 3.6 0.0 - 4.4 ratio    Comment:                                   T. Chol/HDL Ratio                                             Men  Women                               1/2 Avg.Risk  3.4    3.3                                   Avg.Risk  5.0    4.4                                2X Avg.Risk  9.6    7.1                                3X Avg.Risk 23.4   11.0   VITAMIN D  25 Hydroxy (Vit-D Deficiency, Fractures)     Status: Abnormal   Collection Time: 10/12/23  3:29 PM  Result Value Ref Range   Vit D, 25-Hydroxy 15.0 (L) 30.0 - 100.0 ng/mL    Comment: Vitamin D  deficiency has been defined by the Institute of Medicine and an Endocrine Society practice guideline as a level of serum 25-OH vitamin D  less than 20 ng/mL (1,2). The Endocrine Society went on to further define vitamin D  insufficiency as a level between 21 and 29 ng/mL (2). 1. IOM (Institute of Medicine). 2010. Dietary reference    intakes for calcium and D. [place]: The    [employer]. 2. Holick MF,  Binkley , Bischoff-Ferrari HA, et al.    Evaluation, treatment, and prevention of vitamin D     deficiency: an Endocrine Society clinical practice    guideline. JCEM. 2011 Jul; 96(7):1911-30.   CMP14+EGFR     Status: Abnormal   Collection Time: 10/12/23  3:29 PM  Result Value Ref Range   Glucose 87 70 - 99 mg/dL   BUN 16 6 - 20 mg/dL   Creatinine, Ser 1.91 0.57 - 1.00 mg/dL   eGFR 478 >29 FA/OZH/0.86   BUN/Creatinine Ratio 23 9 - 23   Sodium 141 134 - 144  mmol/L   Potassium 4.1 3.5 - 5.2 mmol/L   Chloride 107 (H) 96 - 106 mmol/L   CO2 26 20 - 29 mmol/L   Calcium 9.0 8.7 - 10.2 mg/dL   Total Protein 6.4 6.0 - 8.5 g/dL   Albumin 3.9 3.9 - 4.9 g/dL   Globulin, Total 2.5 1.5 - 4.5 g/dL   Bilirubin Total <3.4 0.0 - 1.2 mg/dL   Alkaline Phosphatase 62 44 - 121 IU/L   AST 6 0 - 40 IU/L   ALT 8 0 - 32 IU/L  TSH     Status: None   Collection Time: 10/12/23  3:29 PM  Result Value Ref Range   TSH 1.250 0.450 - 4.500 uIU/mL  Hemoglobin A1c     Status: None   Collection Time: 10/12/23  3:29 PM  Result Value Ref Range   Hgb A1c MFr Bld 5.2 4.8 - 5.6 %    Comment:          Prediabetes: 5.7 - 6.4          Diabetes: >6.4          Glycemic control for adults with diabetes: <7.0    Est. average glucose Bld gHb Est-mCnc 103 mg/dL  Vitamin B12     Status: None   Collection Time: 10/12/23  3:29 PM  Result Value Ref Range   Vitamin B-12 501 232 - 1,245 pg/mL  CBC with Diff     Status: Abnormal   Collection Time: 10/12/23  3:29 PM  Result Value Ref Range   WBC 7.9 3.4 - 10.8 x10E3/uL   RBC 3.69 (L) 3.77 - 5.28 x10E6/uL   Hemoglobin 10.4 (L) 11.1 - 15.9 g/dL   Hematocrit 74.2 (L) 59.5 - 46.6 %   MCV 86 79 - 97 fL   MCH 28.2 26.6 - 33.0 pg   MCHC 32.8 31.5 - 35.7 g/dL   RDW 63.8 75.6 - 43.3 %   Platelets 334 150 - 450 x10E3/uL   Neutrophils 69 Not Estab. %   Lymphs 23 Not Estab. %   Monocytes 6 Not Estab. %   Eos 2 Not Estab. %   Basos 0 Not Estab. %   Neutrophils Absolute 5.3 1.4 -  7.0 x10E3/uL   Lymphocytes Absolute 1.8 0.7 - 3.1 x10E3/uL   Monocytes Absolute 0.5 0.1 - 0.9 x10E3/uL   EOS (ABSOLUTE) 0.2 0.0 - 0.4 x10E3/uL   Basophils Absolute 0.0 0.0 - 0.2 x10E3/uL   Immature Granulocytes 0 Not Estab. %   Immature Grans (Abs) 0.0 0.0 - 0.1 x10E3/uL       Assessment & Plan:   Problem List Items Addressed This Visit       Other   Adult ADHD (Chronic)   Switching patient to low dose of Vyvanse.  Will recheck at her follow up appointment.       Class 2 severe obesity due to excess calories with serious comorbidity and body mass index (BMI) of 36.0 to 36.9 in adult [facility])   Increasing Wegovy  dose.  Will recheck at follow up.       Relevant Medications   lisdexamfetamine (VYVANSE) 40 MG capsule   Semaglutide -Weight Management (WEGOVY ) 1 MG/0.5ML SOAJ   Other Visit Diagnoses       Encounter for screening for autism    -  Primary   Relevant Orders   Ambulatory referral to Psychiatry       Return in about 1 month (around 01/09/2024) for F/U.   Total time spent:  20 minutes  LEATHER CICCOTELLI, FNP  12/09/2023   This document may have been prepared by [facility] Voice Recognition software and as such may include unintentional dictation errors.

## 2023-12-17 ENCOUNTER — Other Ambulatory Visit: Payer: Self-pay | Admitting: Family

## 2024-01-11 ENCOUNTER — Ambulatory Visit: Admitting: Family

## 2024-01-13 ENCOUNTER — Ambulatory Visit: Admitting: Family

## 2024-01-18 ENCOUNTER — Ambulatory Visit: Admitting: Family

## 2024-01-20 ENCOUNTER — Encounter: Payer: Self-pay | Admitting: Family

## 2024-01-20 ENCOUNTER — Ambulatory Visit: Admitting: Family

## 2024-01-20 VITALS — BP 121/68 | HR 91 | Ht 61.0 in | Wt 186.4 lb

## 2024-01-20 DIAGNOSIS — J029 Acute pharyngitis, unspecified: Secondary | ICD-10-CM | POA: Diagnosis not present

## 2024-01-20 DIAGNOSIS — B370 Candidal stomatitis: Secondary | ICD-10-CM | POA: Diagnosis not present

## 2024-01-20 DIAGNOSIS — Z0130 Encounter for examination of blood pressure without abnormal findings: Secondary | ICD-10-CM

## 2024-01-20 MED ORDER — WEGOVY 1.7 MG/0.75ML ~~LOC~~ SOAJ: 1.7000 mg | SUBCUTANEOUS | 2 refills | Status: DC

## 2024-01-20 MED ORDER — LISDEXAMFETAMINE DIMESYLATE 50 MG PO CAPS: 50.0000 mg | ORAL_CAPSULE | Freq: Every day | ORAL | 0 refills | Status: DC

## 2024-01-20 MED ORDER — LIDOCAINE VISCOUS HCL 2 % MT SOLN: 15.0000 mL | OROMUCOSAL | 1 refills | Status: DC | PRN

## 2024-01-20 MED ORDER — NYSTATIN 100000 UNIT/ML MT SUSP: 5.0000 mL | Freq: Four times a day (QID) | OROMUCOSAL | 0 refills | Status: AC

## 2024-01-21 ENCOUNTER — Other Ambulatory Visit: Payer: Self-pay

## 2024-01-22 ENCOUNTER — Other Ambulatory Visit: Payer: Self-pay | Admitting: Family

## 2024-01-22 ENCOUNTER — Ambulatory Visit: Admitting: Family

## 2024-01-22 ENCOUNTER — Other Ambulatory Visit: Payer: Self-pay

## 2024-01-22 MED ORDER — LIDOCAINE VISCOUS HCL 2 % MT SOLN: 15.0000 mL | OROMUCOSAL | 1 refills | Status: DC | PRN

## 2024-01-22 MED ORDER — LISDEXAMFETAMINE DIMESYLATE 50 MG PO CAPS: 50.0000 mg | ORAL_CAPSULE | Freq: Every day | ORAL | 0 refills | Status: DC

## 2024-01-23 MED ORDER — VYVANSE 50 MG PO CAPS: 50.0000 mg | ORAL_CAPSULE | Freq: Every day | ORAL | 0 refills | Status: DC

## 2024-02-08 ENCOUNTER — Encounter: Payer: Self-pay | Admitting: Family

## 2024-02-08 NOTE — Assessment & Plan Note (Signed)
 Patient stable.  Well controlled with current therapy.   Continue current meds.

## 2024-02-08 NOTE — Assessment & Plan Note (Signed)
 A1C Continues to be in prediabetic ranges.  Will reassess at follow up after next lab check.  Patient counseled on dietary choices and verbalized understanding.   -CBC w/Diff -CMP w/eGFR -Hemoglobin A1C

## 2024-02-08 NOTE — Progress Notes (Signed)
 Established Patient Office Visit  Subjective:  Patient ID: Regina Mays, female    DOB: May 23, 1986  Age: 38 y.o. MRN: [id number]  Chief Complaint  Patient presents with   Follow-up    1 month follow up    Patient is here today for her 1 month follow up.  She has been feeling fairly well since last appointment.   She does not have additional concerns to discuss today.  She has been doing well with the strattera  she thinks, her instructor has told her that he can tell a difference.   Labs are due today. She needs refills.   I have reviewed her active problem list, medication list, allergies, notes from last encounter, lab results for her appointment today.      No other concerns at this time.   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  In rehab   Left ankle pain    Left ankle swelling    Long term current use of systemic steroids 06/09/2022   Long-term use of immunosuppressant medication 06/09/2022   Neuropathic pain of left lower extremity 04/16/2023   Open wound of elbow 04/16/2023   Panic attacks    Recurrent major depressive disorder, in remission (HCC) 06/20/2015   Sepsis due to undetermined organism (HCC) 04/11/2023   Wears dentures    partial upper    Past Surgical History:  Procedure Laterality Date   CYSTOSCOPY N/A 07/30/2017   Procedure: CYSTOSCOPY;  Surgeon: Arloa Lamar SQUIBB, MD;  Location: [facility];  Service: Gynecology;  Laterality: N/A;   LAPAROSCOPIC HYSTERECTOMY Bilateral 07/30/2017   Procedure: HYSTERECTOMY TOTAL LAPAROSCOPIC BILATERAL SALPINGECTOMY;  Surgeon: Arloa Lamar SQUIBB, MD;  Location: [facility];  Service: Gynecology;  Laterality: Bilateral;   LEG SURGERY Left 2016   ankle was broken. 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;  Surgeon: Herminio Miu, MD;  Location: [facility];  Service: ENT;  Laterality: N/A;   TUBAL LIGATION  2007   WISDOM TOOTH EXTRACTION      Social History   Socioeconomic History   Marital status: Married    Spouse name: Not on file   Number of children: Not on file   Years of education: Not on file   Highest education level: Not on file  Occupational History   Not on file  Tobacco Use   Smoking status: Every Day    Current packs/day: 0.50    Average packs/day: 0.5 packs/day for 19.0 years (9.5 ttl pk-yrs)    Types: Cigarettes   Smokeless tobacco: Never   Tobacco comments:    Started smoking age 40  Vaping Use   Vaping status: Never Used  Substance and Sexual Activity   Alcohol use: No   Drug use: No  Comment: suboxone     Sexual activity: Yes    Birth control/protection: None  Other Topics Concern   Not on file  Social History Narrative   Not on file   Social Drivers of Health   Financial Resource Strain: Not on file  Food Insecurity: No Food Insecurity (04/11/2023)   Hunger Vital Sign    Worried About Running Out of Food in the Last Year: Never true    Ran Out of Food in the Last Year: Never true  Transportation Needs: No Transportation Needs (04/11/2023)   PRAPARE - Administrator, Civil Service (Medical): No    Lack of Transportation (Non-Medical): No  Physical Activity: Not on file  Stress: Not on file  Social Connections: Not on file  Intimate Partner Violence: Not At  Risk (04/11/2023)   Humiliation, Afraid, Rape, and Kick questionnaire    Fear of Current or Ex-Partner: No    Emotionally Abused: No    Physically Abused: No    Sexually Abused: No    Family History  Problem Relation Age of Onset   Hypertension Mother    Cancer Mother    Hypertension Father    Diabetes Maternal Grandmother     Allergies  Allergen Reactions   Penicillins Diarrhea and Nausea And Vomiting    Has patient had a PCN reaction causing immediate rash, facial/tongue/throat swelling, SOB or lightheadedness with hypotension: yes Has patient had a PCN reaction causing severe rash involving mucus membranes or skin necrosis: no Has patient had a PCN reaction that required hospitalization: no Has patient had a PCN reaction occurring within the last 10 years: yes If all of the above answers are NO, then may proceed with Cephalosporin use.     SWELLING OF MOUTH/THROAT AND  Diarrhea, n & v    Hydrocodone  Hives and Rash   Toradol  [Ketorolac  Tromethamine ] Rash   Tramadol  Hives and Rash    Review of Systems  All other systems reviewed and are negative.      Objective:   BP (!) 130/90   Pulse 87   Ht 5' 1 (1.549 m)   Wt 200 lb (90.7 kg)   LMP 06/04/2017 (Exact Date)   SpO2 98%   BMI 37.79 kg/m   Vitals:   10/12/23 1429  BP: (!) 130/90  Pulse: 87  Height: 5' 1 (1.549 m)  Weight: 200 lb (90.7 kg)  SpO2: 98%  BMI (Calculated): 37.81    Physical Exam Vitals and nursing note reviewed.  Constitutional:      Appearance: Normal appearance. She is normal weight.  HENT:     Head: Normocephalic.  Eyes:     Extraocular Movements: Extraocular movements intact.     Conjunctiva/sclera: Conjunctivae normal.     Pupils: Pupils are equal, round, and reactive to light.  Cardiovascular:     Rate and Rhythm: Normal rate.  Pulmonary:     Effort: Pulmonary effort is normal.  Neurological:     General: No focal deficit present.     Mental Status: She is alert and  oriented to person, place, and time. Mental status is at baseline.  Psychiatric:        Mood and Affect: Mood normal.        Behavior: Behavior normal.        Thought Content: Thought content normal.        Judgment: Judgment normal.      Results for orders placed or performed in visit on 10/12/23  Lipid panel  Result Value Ref Range   Cholesterol, Total 119 100 - 199 mg/dL   Triglycerides 864 0 - 149 mg/dL   HDL 33 (L) >60 mg/dL   VLDL Cholesterol Cal 24 5 - 40 mg/dL   LDL Chol Calc (NIH) 62 0 - 99 mg/dL   Chol/HDL Ratio 3.6 0.0 - 4.4 ratio  VITAMIN D  25 Hydroxy (Vit-D Deficiency, Fractures)  Result Value Ref Range   Vit D, 25-Hydroxy 15.0 (L) 30.0 - 100.0 ng/mL  CMP14+EGFR  Result Value Ref Range   Glucose 87 70 - 99 mg/dL   BUN 16 6 - 20 mg/dL   Creatinine, Ser 9.29 0.57 - 1.00 mg/dL   eGFR 886 >40 fO/fpw/8.26   BUN/Creatinine Ratio 23 9 - 23   Sodium 141 134 - 144 mmol/L   Potassium 4.1 3.5 - 5.2 mmol/L   Chloride 107 (H) 96 - 106 mmol/L   CO2 26 20 - 29 mmol/L   Calcium 9.0 8.7 - 10.2 mg/dL   Total Protein 6.4 6.0 - 8.5 g/dL   Albumin 3.9 3.9 - 4.9 g/dL   Globulin, Total 2.5 1.5 - 4.5 g/dL   Bilirubin Total <9.7 0.0 - 1.2 mg/dL   Alkaline Phosphatase 62 44 - 121 IU/L   AST 6 0 - 40 IU/L   ALT 8 0 - 32 IU/L  TSH  Result Value Ref Range   TSH 1.250 0.450 - 4.500 uIU/mL  Hemoglobin A1c  Result Value Ref Range   Hgb A1c MFr Bld 5.2 4.8 - 5.6 %   Est. average glucose Bld gHb Est-mCnc 103 mg/dL  Vitamin A87  Result Value Ref Range   Vitamin B-12 501 232 - 1,245 pg/mL  CBC with Diff  Result Value Ref Range   WBC 7.9 3.4 - 10.8 x10E3/uL   RBC 3.69 (L) 3.77 - 5.28 x10E6/uL   Hemoglobin 10.4 (L) 11.1 - 15.9 g/dL   Hematocrit 68.2 (L) 65.9 - 46.6 %   MCV 86 79 - 97 fL   MCH 28.2 26.6 - 33.0 pg   MCHC 32.8 31.5 - 35.7 g/dL   RDW 86.5 88.2 - 84.5 %   Platelets 334 150 - 450 x10E3/uL   Neutrophils 69 Not Estab. %   Lymphs 23 Not Estab. %   Monocytes 6 Not Estab.  %   Eos 2 Not Estab. %   Basos 0 Not Estab. %   Neutrophils Absolute 5.3 1.4 - 7.0 x10E3/uL   Lymphocytes Absolute 1.8 0.7 - 3.1 x10E3/uL   Monocytes Absolute 0.5 0.1 - 0.9 x10E3/uL   EOS (ABSOLUTE) 0.2 0.0 - 0.4 x10E3/uL   Basophils Absolute 0.0 0.0 - 0.2 x10E3/uL   Immature Granulocytes 0 Not Estab. %   Immature Grans (Abs) 0.0 0.0 - 0.1 x10E3/uL    No results found for this or any previous visit (from the past 2160 hours).     Assessment & Plan Prediabetes A1C Continues to be in prediabetic ranges.  Will reassess at follow up after next lab check.  Patient counseled on dietary choices and verbalized understanding.   -CBC w/Diff -CMP w/eGFR -Hemoglobin A1C  B12 deficiency due to diet Vitamin D  deficiency, unspecified Other fatigue Hypothyroidism (acquired) Checking labs today.  Will continue supplements as needed.   - Vitamin D  - Vitamin B12 - TSH  Essential hypertension, benign Blood pressure well controlled with current medications.  Continue current therapy.  Will reassess at follow up.   - CBC w/Diff - CMP w/eGFR  Mixed hyperlipidemia Checking labs today.  Continue current therapy for lipid control. Will modify as needed based on labwork results.   -CMP w/eGFR -Lipid Panel  Adult ADHD Patient stable.  Well controlled with current therapy.   Continue current meds.      Return in about 1 month (around 11/12/2023) for F/U.   Total time spent: 20 minutes  AZALIA NEUBERGER, FNP  10/12/2023   This document may have been prepared by [facility] Voice Recognition software and as such may include unintentional dictation errors.

## 2024-02-19 ENCOUNTER — Ambulatory Visit: Admitting: Family

## 2024-02-22 ENCOUNTER — Telehealth: Payer: Self-pay | Admitting: Family

## 2024-02-22 NOTE — Telephone Encounter (Signed)
 Patient left VM that she needs refills sent in. She doesn't have a way to come in for an appointment. She didn't state which meds need refills at this time.

## 2024-02-23 ENCOUNTER — Other Ambulatory Visit: Payer: Self-pay

## 2024-02-24 ENCOUNTER — Telehealth: Payer: Self-pay

## 2024-02-24 MED ORDER — WEGOVY 1.7 MG/0.75ML ~~LOC~~ SOAJ: 1.7000 mg | SUBCUTANEOUS | 2 refills | Status: AC

## 2024-02-24 NOTE — Telephone Encounter (Signed)
 Pt called regarding rx vyvanse  refill- asked if you could lower the dose back to 40mg ? Please advise

## 2024-02-25 ENCOUNTER — Telehealth: Payer: Self-pay

## 2024-02-25 MED ORDER — VYVANSE 40 MG PO CAPS: 40.0000 mg | ORAL_CAPSULE | ORAL | 0 refills | Status: DC

## 2024-02-25 NOTE — Telephone Encounter (Signed)
 We have sent the lowered dose request to PCP

## 2024-02-25 NOTE — Telephone Encounter (Signed)
 Pt needs refill on lowered dose VYVANSE , has been out all week  [facility] in [place]

## 2024-02-26 NOTE — Telephone Encounter (Signed)
 This was already requested to the provider

## 2024-03-04 ENCOUNTER — Ambulatory Visit: Admitting: Family

## 2024-03-04 ENCOUNTER — Encounter: Payer: Self-pay | Admitting: Family

## 2024-03-04 VITALS — BP 128/80 | HR 105 | Ht 61.0 in | Wt 182.6 lb

## 2024-03-04 DIAGNOSIS — F419 Anxiety disorder, unspecified: Secondary | ICD-10-CM

## 2024-03-04 DIAGNOSIS — Z0130 Encounter for examination of blood pressure without abnormal findings: Secondary | ICD-10-CM

## 2024-03-04 DIAGNOSIS — F909 Attention-deficit hyperactivity disorder, unspecified type: Secondary | ICD-10-CM | POA: Diagnosis not present

## 2024-03-04 DIAGNOSIS — F32A Depression, unspecified: Secondary | ICD-10-CM

## 2024-03-04 DIAGNOSIS — D849 Immunodeficiency, unspecified: Secondary | ICD-10-CM

## 2024-03-04 DIAGNOSIS — L929 Granulomatous disorder of the skin and subcutaneous tissue, unspecified: Secondary | ICD-10-CM | POA: Diagnosis not present

## 2024-03-04 MED ORDER — VYVANSE 20 MG PO CAPS
20.0000 mg | ORAL_CAPSULE | Freq: Every day | ORAL | 0 refills | Status: DC
Start: 2024-03-04 — End: 2024-03-15

## 2024-03-04 MED ORDER — AMPHETAMINE-DEXTROAMPHETAMINE 10 MG PO TABS: 10.0000 mg | ORAL_TABLET | Freq: Every day | ORAL | 0 refills | Status: DC

## 2024-03-04 MED ORDER — LISDEXAMFETAMINE DIMESYLATE 60 MG PO CAPS: 60.0000 mg | ORAL_CAPSULE | ORAL | 0 refills | Status: DC

## 2024-03-07 ENCOUNTER — Other Ambulatory Visit: Payer: Self-pay | Admitting: Family

## 2024-03-07 ENCOUNTER — Ambulatory Visit: Admitting: Family

## 2024-03-15 ENCOUNTER — Ambulatory Visit: Admitting: Family

## 2024-03-15 ENCOUNTER — Encounter: Payer: Self-pay | Admitting: Family

## 2024-03-15 ENCOUNTER — Other Ambulatory Visit: Payer: Self-pay

## 2024-03-15 VITALS — BP 114/72 | HR 88 | Ht 61.0 in | Wt 179.0 lb

## 2024-03-15 DIAGNOSIS — J029 Acute pharyngitis, unspecified: Secondary | ICD-10-CM

## 2024-03-15 DIAGNOSIS — J069 Acute upper respiratory infection, unspecified: Secondary | ICD-10-CM

## 2024-03-15 LAB — POCT XPERT XPRESS SARS COVID-2/FLU/RSV
FLU A: NEGATIVE
FLU B: NEGATIVE
RSV RNA, PCR: NEGATIVE
SARS Coronavirus 2: NEGATIVE

## 2024-03-15 LAB — POCT RAPID STREP A (OFFICE): Rapid Strep A Screen: NEGATIVE

## 2024-03-15 NOTE — Progress Notes (Unsigned)
 Established Patient Office Visit  Subjective:  Patient ID: Regina Mays, female    DOB: June 26, 1986  Age: 38 y.o. MRN: [id number]  Chief Complaint  Patient presents with   Follow-up    2 week follow up    HPI  No other concerns at this time.   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  In rehab   Left ankle pain    Left ankle swelling    Long term current use of systemic steroids 06/09/2022   Long-term use of immunosuppressant medication 06/09/2022   Neuropathic pain of left lower extremity 04/16/2023   Open wound of elbow 04/16/2023   Panic attacks    Recurrent major depressive disorder, in remission (HCC) 06/20/2015   Sepsis due to undetermined organism (HCC) 04/11/2023   Wears dentures    partial upper    Past Surgical History:  Procedure Laterality Date   CYSTOSCOPY N/A 07/30/2017   Procedure: CYSTOSCOPY;  Surgeon: Arloa Lamar SQUIBB, MD;  Location: [facility];  Service: Gynecology;  Laterality: N/A;   LAPAROSCOPIC HYSTERECTOMY Bilateral 07/30/2017   Procedure: HYSTERECTOMY TOTAL LAPAROSCOPIC BILATERAL SALPINGECTOMY;  Surgeon: Arloa Lamar SQUIBB, MD;  Location: [facility];  Service: Gynecology;  Laterality: Bilateral;   LEG SURGERY Left 2016   ankle was broken. 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;  Surgeon: Herminio Miu, MD;  Location: [facility];  Service: ENT;  Laterality: N/A;   TUBAL LIGATION  2007   WISDOM TOOTH EXTRACTION      Social History   Socioeconomic History   Marital status: Married    Spouse name: Not on file   Number of children: Not on file   Years of education: Not on file   Highest education level: Not on file  Occupational History   Not on file  Tobacco Use   Smoking status: Every Day    Current packs/day: 0.50    Average packs/day: 0.5 packs/day for 19.0 years (9.5 ttl pk-yrs)    Types: Cigarettes   Smokeless tobacco: Never   Tobacco comments:    Started smoking age 80  Vaping Use   Vaping status: Never Used  Substance and Sexual Activity   Alcohol use: No   Drug use: No    Comment: suboxone     Sexual activity: Yes    Birth control/protection: None  Other Topics Concern   Not on file  Social History Narrative   Not on file   Social Drivers of Health   Financial Resource Strain: Not on file  Food Insecurity: No Food Insecurity (04/11/2023)   Hunger Vital Sign    Worried About Running Out of Food in the Last Year: Never true    Ran Out of Food in the Last Year:  Never true  Transportation Needs: No Transportation Needs (04/11/2023)   PRAPARE - Administrator, Civil Service (Medical): No    Lack of Transportation (Non-Medical): No  Physical Activity: Not on file  Stress: Not on file  Social Connections: Not on file  Intimate Partner Violence: Not At Risk (04/11/2023)   Humiliation, Afraid, Rape, and Kick questionnaire    Fear of Current or Ex-Partner: No    Emotionally Abused: No    Physically Abused: No    Sexually Abused: No    Family History  Problem Relation Age of Onset   Hypertension Mother    Cancer Mother    Hypertension Father    Diabetes Maternal Grandmother     Allergies  Allergen Reactions   Penicillins Diarrhea and Nausea And Vomiting    Has patient had a PCN reaction causing  immediate rash, facial/tongue/throat swelling, SOB or lightheadedness with hypotension: yes Has patient had a PCN reaction causing severe rash involving mucus membranes or skin necrosis: no Has patient had a PCN reaction that required hospitalization: no Has patient had a PCN reaction occurring within the last 10 years: yes If all of the above answers are NO, then may proceed with Cephalosporin use.     SWELLING OF MOUTH/THROAT AND  Diarrhea, n & v    Hydrocodone  Hives and Rash   Toradol  [Ketorolac  Tromethamine ] Rash   Tramadol  Hives and Rash    Review of Systems  All other systems reviewed and are negative.      Objective:   BP 114/72   Pulse 88   Ht 5' 1 (1.549 m)   Wt 179 lb (81.2 kg)   LMP 06/04/2017 (Exact Date)   SpO2 99%   BMI 33.82 kg/m   Vitals:   03/15/24 1438  BP: 114/72  Pulse: 88  Height: 5' 1 (1.549 m)  Weight: 179 lb (81.2 kg)  SpO2: 99%  BMI (Calculated): 33.84    Physical Exam Vitals and nursing note reviewed.  Constitutional:      Appearance: Normal appearance. She is normal weight.  HENT:     Head: Normocephalic.  Eyes:     Extraocular Movements: Extraocular movements intact.     Conjunctiva/sclera: Conjunctivae normal.     Pupils: Pupils are equal, round, and reactive to light.  Cardiovascular:     Rate and Rhythm: Normal rate.  Pulmonary:     Effort: Pulmonary effort is normal.  Neurological:     General: No focal deficit present.     Mental Status: She is alert and oriented to person, place, and time. Mental status is at baseline.  Psychiatric:        Mood and Affect: Mood normal.        Behavior: Behavior normal.        Thought Content: Thought content normal.      No results found for any visits on 03/15/24.  No results found for this or any previous visit (from the past 2160 hours).     Assessment & Plan:   Assessment & Plan     No follow-ups on file.   Total time spent: 20 minutes  VICKYE ASTORINO,  FNP  03/15/2024   This document may have been prepared by [facility] Voice Recognition software and as such may include unintentional dictation errors.

## 2024-03-16 ENCOUNTER — Ambulatory Visit: Payer: Self-pay | Admitting: Family

## 2024-03-16 ENCOUNTER — Encounter: Payer: Self-pay | Admitting: Family

## 2024-03-16 MED ORDER — LISDEXAMFETAMINE DIMESYLATE 60 MG PO CAPS: 60.0000 mg | ORAL_CAPSULE | ORAL | 0 refills | Status: DC

## 2024-03-16 NOTE — Progress Notes (Signed)
 Acute Office Visit  Subjective:     Patient ID: Regina Mays, female    DOB: 11/06/1985, 38 y.o.   MRN: [id number]  Patient is in today for  Chief Complaint  Patient presents with   Acute Visit   Follow-up    Sore Throat  This is a new problem. The current episode started in the past 7 days. The problem has been gradually worsening. Neither side of throat is experiencing more pain than the other. There has been no fever. The pain is at a severity of 9/10. The pain is severe. Associated symptoms include trouble swallowing. She has had no exposure to strep or mono. She has tried acetaminophen , gargles, cool liquids and NSAIDs for the symptoms. The treatment provided no relief.     Review of Systems  HENT:  Positive for sore throat and trouble swallowing.   All other systems reviewed and are negative.       Objective:    BP 121/68   Pulse 91   Ht 5' 1 (1.549 m)   Wt 186 lb 6.4 oz (84.6 kg)   LMP 06/04/2017 (Exact Date)   SpO2 99%   BMI 35.22 kg/m   Physical Exam HENT:     Mouth/Throat:     Pharynx: Pharyngeal swelling, oropharyngeal exudate and posterior oropharyngeal erythema present.     Tonsils: Tonsillar exudate present.     Comments: Palate partially absent     No results found for any visits on 01/20/24.  Recent Results (from the past 2160 hours)  POCT rapid strep A     Status: None   Collection Time: 03/15/24  3:32 PM  Result Value Ref Range   Rapid Strep A Screen Negative Negative  POCT XPERT XPRESS SARS COVID-2/FLU/RSV     Status: None   Collection Time: 03/15/24  3:43 PM  Result Value Ref Range   SARS Coronavirus 2 Negative    FLU A Negative    FLU B Negative    RSV RNA, PCR Negative     Allergies as of 01/20/2024       Reactions   Penicillins Diarrhea, Nausea And Vomiting   Has patient had a PCN reaction causing immediate rash, facial/tongue/throat swelling, SOB or lightheadedness with hypotension: yes Has patient had a PCN reaction  causing severe rash involving mucus membranes or skin necrosis: no Has patient had a PCN reaction that required hospitalization: no Has patient had a PCN reaction occurring within the last 10 years: yes If all of the above answers are NO, then may proceed with Cephalosporin use.     SWELLING OF MOUTH/THROAT AND  Diarrhea, n & v   Hydrocodone  Hives, Rash   Toradol  [ketorolac  Tromethamine ] Rash   Tramadol  Hives, Rash        Medication List        Accurate as of January 20, 2024 11:59 PM. If you have any questions, ask your nurse or doctor.          STOP taking these medications    Wegovy  1 MG/0.5ML Soaj SQ injection Generic drug: semaglutide -weight management Replaced by: Wegovy  1.7 MG/0.75ML Soaj SQ injection Stopped by: ALAN CHRISTELLA ARRANT       TAKE these medications    atomoxetine  60 MG capsule Commonly known as: STRATTERA  TAKE 1 CAPSULE(60 MG) BY MOUTH DAILY   buPROPion 300 MG 24 hr tablet Commonly known as: WELLBUTRIN XL Take 300 mg by mouth daily.   Calcium Carb-Cholecalciferol 600-10 MG-MCG Tabs Take by mouth.  clonazePAM 0.5 MG tablet Commonly known as: KLONOPIN Take 0.5 mg by mouth 2 (two) times daily as needed.   gabapentin 300 MG capsule Commonly known as: NEURONTIN Take 600 mg by mouth 3 (three) times daily.   lidocaine  2 % solution Commonly known as: XYLOCAINE  Use as directed 15 mLs in the mouth or throat as needed for mouth pain. Started by: Taylar Hartsough M Deckard Stuber   lisdexamfetamine 50 MG capsule Commonly known as: VYVANSE  Take 1 capsule (50 mg total) by mouth daily. What changed:  medication strength how much to take when to take this Changed by: Aeriel Boulay M Kymari Lollis   methocarbamol  500 MG tablet Commonly known as: ROBAXIN  Take 1 tablet (500 mg total) by mouth every 8 (eight) hours as needed for muscle spasms.   metroNIDAZOLE  500 MG tablet Commonly known as: FLAGYL  Take 1 tablet (500 mg total) by mouth 3 (three) times daily.   nystatin   100000 UNIT/ML suspension Commonly known as: MYCOSTATIN  Take 5 mLs (500,000 Units total) by mouth 4 (four) times daily. Started by: ALAN CHRISTELLA ARRANT   ondansetron  4 MG tablet Commonly known as: ZOFRAN  Take 4 mg by mouth 2 (two) times daily.   predniSONE 5 MG tablet Commonly known as: DELTASONE Take 5 mg by mouth daily with breakfast.   QUEtiapine  300 MG 24 hr tablet Commonly known as: SEROQUEL  XR Take 300 mg by mouth at bedtime.   Suboxone  8-2 MG Film Generic drug: Buprenorphine  HCl-Naloxone  HCl Place 1 Film under the tongue 3 (three) times daily.   Vitamin D  (Ergocalciferol ) 1.25 MG (50000 UNIT) Caps capsule Commonly known as: DRISDOL  Take 1 capsule (50,000 Units total) by mouth every 7 (seven) days.   Wegovy  1.7 MG/0.75ML Soaj SQ injection Generic drug: semaglutide -weight management Inject 1.7 mg into the skin once a week. Replaces: Wegovy  1 MG/0.5ML Soaj SQ injection Started by: ALAN CHRISTELLA ARRANT            Assessment & Plan Thrush Sending nystatin .  Will let me know if she is not improving.  Pharyngitis, unspecified etiology Sending viscous lidocaine  to help with pain.  She will let me know if not improving.     Return as previously scheduled unless not improving.  Total time spent: 20 minutes  ROSHUNDA KEIR, FNP  01/20/2024   This document may have been prepared by [facility] Voice Recognition software and as such may include unintentional dictation errors.

## 2024-03-17 ENCOUNTER — Encounter: Payer: Self-pay | Admitting: Family

## 2024-03-17 NOTE — Assessment & Plan Note (Signed)
Patient is seen by rheumatology, who manage this condition.  She is well controlled with current therapy.   Will defer to them for further changes to plan of care.

## 2024-03-17 NOTE — Assessment & Plan Note (Signed)
 Sending rx to pharmacy for Vyvanse  20mg  to take in addition to her 40mg  dose.  Will increase to 60mg  for next month's RX.

## 2024-03-17 NOTE — Progress Notes (Signed)
 Established Regina Mays Office Visit  Subjective:  Regina Mays ID: Regina Mays, female    DOB: October 14, 1985  Age: 38 y.o. MRN: [id number]  Chief Complaint  Regina Mays presents with   Follow-Mays    Medication refills    Regina Mays.  Regina Mays has been feeling fairly well since last appointment.   Regina Mays does have additional concerns to discuss today.  Regina Mays previously that we decrease the Vyvanse , but Mays now having issues with it not being effective enough.  Asks if we can increase dose back Mays.   Labs are not due today.  Regina Mays needs refills.   I have reviewed Regina Mays active problem list, medication list, allergies, notes from last encounter, lab results for Regina Mays appointment today.      No other concerns at this time.   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  In rehab   Left ankle pain    Left ankle swelling    Long term current use of systemic steroids 06/09/2022   Long-term use of immunosuppressant medication 06/09/2022   Neuropathic pain of left lower extremity 04/16/2023   Open wound of elbow 04/16/2023   Panic attacks    Recurrent major depressive disorder, in remission (HCC) 06/20/2015   Sepsis due to undetermined organism (HCC) 04/11/2023   Wears dentures    partial upper    Past Surgical History:  Procedure Laterality Date   CYSTOSCOPY N/A 07/30/2017    Procedure: CYSTOSCOPY;  Surgeon: Arloa Lamar SQUIBB, MD;  Location: [facility];  Service: Gynecology;  Laterality: N/A;   LAPAROSCOPIC HYSTERECTOMY Bilateral 07/30/2017   Procedure: HYSTERECTOMY TOTAL LAPAROSCOPIC BILATERAL SALPINGECTOMY;  Surgeon: Arloa Lamar SQUIBB, MD;  Location: [facility];  Service: Gynecology;  Laterality: Bilateral;   LEG SURGERY Left 2016   ankle was broken. 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;  Surgeon: Herminio Miu, MD;  Location: [facility];  Service: ENT;  Laterality: N/A;   TUBAL LIGATION  2007   WISDOM TOOTH EXTRACTION      Social History   Socioeconomic History   Marital status: Married    Spouse name: Not on file   Number of children: Not on file   Years of education: Not on file   Highest education level: Not on file  Occupational History   Not on file  Tobacco Use   Smoking status: Every Day    Current packs/day: 0.50    Average packs/day: 0.5 packs/day for 19.0 years (9.5 ttl pk-yrs)    Types: Cigarettes   Smokeless tobacco: Never   Tobacco comments:    Started smoking age 44  Vaping Use   Vaping status: Never Used  Substance and Sexual Activity   Alcohol use:  No   Drug use: No    Comment: suboxone     Sexual activity: Yes    Birth control/protection: None  Other Topics Concern   Not on file  Social History Narrative   Not on file   Social Drivers of Health   Financial Resource Strain: Not on file  Food Insecurity: No Food Insecurity (04/11/2023)   Hunger Vital Sign    Worried About Running Out of Food in the Last Year: Never true    Ran Out of Food in the Last Year: Never true  Transportation Needs: No Transportation Needs (04/11/2023)   PRAPARE - Administrator, Civil Service (Medical): No    Lack of Transportation (Non-Medical): No  Physical Activity: Not on file  Stress: Not on file  Social Connections: Not on  file  Intimate Partner Violence: Not At Risk (04/11/2023)   Humiliation, Afraid, Rape, and Kick questionnaire    Fear of Current or Ex-Partner: No    Emotionally Abused: No    Physically Abused: No    Sexually Abused: No    Family History  Problem Relation Age of Onset   Hypertension Mother    Cancer Mother    Hypertension Father    Diabetes Maternal Grandmother     Allergies  Allergen Reactions   Penicillins Diarrhea and Nausea And Vomiting    Has Regina Mays had a PCN reaction causing immediate rash, facial/tongue/throat swelling, SOB or lightheadedness with hypotension: yes Has Regina Mays had a PCN reaction causing severe rash involving mucus membranes or skin necrosis: no Has Regina Mays had a PCN reaction that required hospitalization: no Has Regina Mays had a PCN reaction occurring within the last 10 years: yes If all of the above answers are NO, then may proceed with Cephalosporin use.     SWELLING OF MOUTH/THROAT AND  Diarrhea, n & v    Hydrocodone  Hives and Rash   Toradol  [Ketorolac  Tromethamine ] Rash   Tramadol  Hives and Rash    Review of Systems  All other systems reviewed and are negative.      Objective:   BP 128/80   Pulse (!) 105   Ht 5' 1 (1.549 m)   Wt 182 lb 9.6 oz (82.8 kg)   LMP 06/04/2017 (Exact Date)   SpO2 97%   BMI 34.50 kg/m   Vitals:   03/04/24 1434  BP: 128/80  Pulse: (!) 105  Height: 5' 1 (1.549 m)  Weight: 182 lb 9.6 oz (82.8 kg)  SpO2: 97%  BMI (Calculated): 34.52    Physical Exam Vitals and nursing note reviewed.  Constitutional:      Appearance: Normal appearance. Regina Mays normal weight.  HENT:     Head: Normocephalic.  Eyes:     Extraocular Movements: Extraocular movements intact.     Conjunctiva/sclera: Conjunctivae normal.     Pupils: Pupils are equal, round, and reactive to light.  Cardiovascular:     Rate and Rhythm: Normal rate.  Pulmonary:     Effort: Pulmonary effort Mays normal.  Neurological:     General: No focal  deficit present.     Mental Status: Regina Mays alert and oriented to person, place, and time. Mental status Mays at baseline.  Psychiatric:        Attention and Perception: Attention and perception normal.        Mood and Affect: Mood Mays anxious and depressed. Affect Mays tearful.        Speech: Speech normal.  Behavior: Behavior normal. Behavior Mays cooperative.        Thought Content: Thought content normal.        Cognition and Memory: Cognition and memory normal.        Judgment: Judgment normal.      No results found for any visits on 03/04/24.  Recent Results (from the past 2160 hours)  POCT rapid strep A     Status: None   Collection Time: 03/15/24  3:32 PM  Result Value Ref Range   Rapid Strep A Screen Negative Negative  POCT XPERT XPRESS SARS COVID-2/FLU/RSV     Status: None   Collection Time: 03/15/24  3:43 PM  Result Value Ref Range   SARS Coronavirus 2 Negative    FLU A Negative    FLU B Negative    RSV RNA, PCR Negative        Assessment & Plan Adult ADHD Sending rx to pharmacy for Vyvanse  20mg  to take in addition to Regina Mays 40mg  dose.  Will increase to 60mg  for next month's RX.   Anxiety and depression Regina Mays Mays.  Well controlled with current therapy.   Continue current meds.   Immunosuppression (HCC) Granulomatosis Regina Mays, who manage this condition.  Regina Mays well controlled with current therapy.   Will defer to them for further changes to plan of care.     Return in about 2 weeks (around 03/18/2024) for F/U.   Total time spent: 20 minutes  LYNDIE VANDERLOOP, FNP  03/04/2024   This document may have been prepared by [facility] Voice Recognition software and as such may include unintentional dictation errors.

## 2024-03-17 NOTE — Assessment & Plan Note (Signed)
 Patient stable.  Well controlled with current therapy.   Continue current meds.

## 2024-04-22 ENCOUNTER — Other Ambulatory Visit: Payer: Self-pay | Admitting: Family

## 2024-05-09 ENCOUNTER — Ambulatory Visit: Admitting: Family

## 2024-05-09 ENCOUNTER — Other Ambulatory Visit: Payer: Self-pay | Admitting: Family

## 2024-05-10 NOTE — Telephone Encounter (Signed)
 Pt LM asking for call back at 12:47P

## 2024-05-11 ENCOUNTER — Telehealth: Payer: Self-pay

## 2024-05-11 ENCOUNTER — Other Ambulatory Visit: Payer: Self-pay

## 2024-05-11 NOTE — Telephone Encounter (Signed)
 Pt tried to pick up vyvanse  but it is no longer on her med list.

## 2024-05-11 NOTE — Telephone Encounter (Signed)
 Pt is requesting refills but did not state which ones.

## 2024-05-12 MED ORDER — AMPHETAMINE-DEXTROAMPHETAMINE 10 MG PO TABS: 10.0000 mg | ORAL_TABLET | Freq: Every day | ORAL | 0 refills | Status: DC

## 2024-05-12 MED ORDER — VYVANSE 60 MG PO CAPS: 60.0000 mg | ORAL_CAPSULE | ORAL | 0 refills | Status: DC

## 2024-06-01 ENCOUNTER — Ambulatory Visit: Admitting: Family

## 2024-06-10 ENCOUNTER — Ambulatory Visit
Admission: RE | Admit: 2024-06-10 | Discharge: 2024-06-10 | Disposition: A | Discharge status: Home / Self Care | Attending: Family | Admitting: Family

## 2024-06-10 ENCOUNTER — Ambulatory Visit: Admitting: Family

## 2024-06-10 ENCOUNTER — Encounter: Payer: Self-pay | Admitting: Family

## 2024-06-10 ENCOUNTER — Ambulatory Visit: Payer: Self-pay

## 2024-06-10 ENCOUNTER — Ambulatory Visit
Admission: RE | Admit: 2024-06-10 | Discharge: 2024-06-10 | Disposition: A | Discharge status: Home / Self Care | Source: Ambulatory Visit

## 2024-06-10 VITALS — BP 122/88 | HR 96 | Ht 61.0 in | Wt 181.8 lb

## 2024-06-10 DIAGNOSIS — J189 Pneumonia, unspecified organism: Secondary | ICD-10-CM | POA: Insufficient documentation

## 2024-06-10 DIAGNOSIS — R110 Nausea: Secondary | ICD-10-CM

## 2024-06-10 DIAGNOSIS — E538 Deficiency of other specified B group vitamins: Secondary | ICD-10-CM | POA: Diagnosis not present

## 2024-06-10 DIAGNOSIS — J4531 Mild persistent asthma with (acute) exacerbation: Secondary | ICD-10-CM

## 2024-06-10 DIAGNOSIS — R5383 Other fatigue: Secondary | ICD-10-CM

## 2024-06-10 DIAGNOSIS — E559 Vitamin D deficiency, unspecified: Secondary | ICD-10-CM

## 2024-06-10 DIAGNOSIS — E782 Mixed hyperlipidemia: Secondary | ICD-10-CM

## 2024-06-10 DIAGNOSIS — I10 Essential (primary) hypertension: Secondary | ICD-10-CM | POA: Diagnosis not present

## 2024-06-10 DIAGNOSIS — E039 Hypothyroidism, unspecified: Secondary | ICD-10-CM

## 2024-06-10 DIAGNOSIS — F32A Depression, unspecified: Secondary | ICD-10-CM

## 2024-06-10 DIAGNOSIS — R7303 Prediabetes: Secondary | ICD-10-CM

## 2024-06-10 DIAGNOSIS — F419 Anxiety disorder, unspecified: Secondary | ICD-10-CM

## 2024-06-10 DIAGNOSIS — Z1231 Encounter for screening mammogram for malignant neoplasm of breast: Secondary | ICD-10-CM

## 2024-06-10 MED ORDER — ONDANSETRON HCL 4 MG PO TABS: 4.0000 mg | ORAL_TABLET | Freq: Two times a day (BID) | ORAL | 1 refills | Status: DC

## 2024-06-10 MED ORDER — ONDANSETRON HCL 4 MG PO TABS: 4.0000 mg | ORAL_TABLET | Freq: Two times a day (BID) | ORAL | 1 refills | Status: AC

## 2024-06-10 MED ORDER — LEVOFLOXACIN 750 MG PO TABS: 750.0000 mg | ORAL_TABLET | Freq: Every day | ORAL | 0 refills | Status: DC

## 2024-06-10 MED ORDER — CAPLYTA 21 MG PO CAPS: ORAL_CAPSULE | ORAL | 1 refills | Status: DC

## 2024-06-10 MED ORDER — VITAMIN D (ERGOCALCIFEROL) 1.25 MG (50000 UNIT) PO CAPS: 50000.0000 [IU] | ORAL_CAPSULE | ORAL | 3 refills | Status: AC

## 2024-06-10 MED ORDER — ALBUTEROL SULFATE HFA 108 (90 BASE) MCG/ACT IN AERS: 2.0000 | INHALATION_SPRAY | Freq: Four times a day (QID) | RESPIRATORY_TRACT | 2 refills | Status: AC | PRN

## 2024-06-10 MED ORDER — BUPROPION HCL ER (XL) 300 MG PO TB24: 300.0000 mg | ORAL_TABLET | Freq: Every day | ORAL | 3 refills | Status: AC

## 2024-06-10 MED ORDER — AMPHETAMINE-DEXTROAMPHETAMINE 10 MG PO TABS: 10.0000 mg | ORAL_TABLET | Freq: Every day | ORAL | 0 refills | Status: DC

## 2024-06-10 MED ORDER — ALBUTEROL SULFATE (2.5 MG/3ML) 0.083% IN NEBU: 2.5000 mg | INHALATION_SOLUTION | RESPIRATORY_TRACT | 2 refills | Status: DC | PRN

## 2024-06-10 MED ORDER — LEVOFLOXACIN 500 MG PO TABS: 500.0000 mg | ORAL_TABLET | Freq: Every day | ORAL | 0 refills | Status: DC

## 2024-06-10 MED ORDER — VITAMIN D (ERGOCALCIFEROL) 1.25 MG (50000 UNIT) PO CAPS
50000.0000 [IU] | ORAL_CAPSULE | ORAL | 3 refills | Status: DC
Start: 2024-06-10 — End: 2024-06-10

## 2024-06-10 MED ORDER — ALBUTEROL SULFATE HFA 108 (90 BASE) MCG/ACT IN AERS: 2.0000 | INHALATION_SPRAY | Freq: Four times a day (QID) | RESPIRATORY_TRACT | 2 refills | Status: DC | PRN

## 2024-06-10 MED ORDER — BUPROPION HCL ER (XL) 300 MG PO TB24
300.0000 mg | ORAL_TABLET | Freq: Every day | ORAL | 3 refills | Status: DC
Start: 2024-06-10 — End: 2024-06-10

## 2024-06-10 MED ORDER — CAPLYTA 21 MG PO CAPS: ORAL_CAPSULE | ORAL | 1 refills | Status: AC

## 2024-06-10 MED ORDER — LEVOFLOXACIN 750 MG PO TABS: 750.0000 mg | ORAL_TABLET | Freq: Every day | ORAL | 0 refills | Status: AC

## 2024-06-10 MED ORDER — VYVANSE 60 MG PO CAPS: 60.0000 mg | ORAL_CAPSULE | ORAL | 0 refills | Status: DC

## 2024-06-10 MED ORDER — ALBUTEROL SULFATE (2.5 MG/3ML) 0.083% IN NEBU: 2.5000 mg | INHALATION_SOLUTION | RESPIRATORY_TRACT | 2 refills | Status: AC | PRN

## 2024-06-10 NOTE — Progress Notes (Signed)
 Patient notified

## 2024-06-10 NOTE — Progress Notes (Signed)
 Established Patient Office Visit  Subjective:  Patient ID: Regina Mays, female    DOB: November 16, 1985  Age: 38 y.o. MRN: [id number]  Chief Complaint  Patient presents with   Cough   Nasal Congestion    Patient is here today for acute visit.  She has been feeling poorly since last appointment.   She does have additional concerns to discuss today. Endorses chest tightness, cough that is productive. She reports symptoms started in her head with headache, nasal congestion and progressed into her chest over the last 2 weeks. She states she tested negative for covid. She reports fever and chills on and off and last time was 3 days ago. She denies taking OTC medications due to her chemotherapy and all her other medications. Will order Chest xray and start Levaquin daily for 10 days.  She states she has been wheezing even prior to her recent illness. She reports she has hx of asthma but has not had inhalers or breathing treatments in quite some time. Will send in albuterol inhaler for as needed wheezing and shortness of breath. Will also order nebulizer   She also has concerns of not sleeping. She had been taking Seroquel  300 mg at night but was stopped. She reports stopping it due to taking to many medications. She reports changes in her mood, increased anxiety and depression since stopping the Seroquel . Will try Capltya instead of restarting Seroquel .  Labs are due today.  She does need refills. Including vyvanse  and adderal  I have reviewed her active problem list, medication list, allergies, family history, social history, health maintenance, notes from last encounter, lab results for her appointment today.      No other concerns at this time.   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  In rehab   Left ankle pain    Left ankle swelling    Long term current use of systemic steroids 06/09/2022   Long-term use of immunosuppressant medication 06/09/2022   Neuropathic pain of left lower extremity 04/16/2023   Open wound of elbow 04/16/2023   Panic attacks    Recurrent major depressive disorder, in remission 06/20/2015   Sepsis due to undetermined organism (HCC) 04/11/2023   Wears dentures    partial upper    Past Surgical History:  Procedure Laterality Date   CYSTOSCOPY N/A 07/30/2017   Procedure: CYSTOSCOPY;  Surgeon: Arloa Lamar SQUIBB, MD;  Location: [facility];  Service: Gynecology;  Laterality: N/A;   LAPAROSCOPIC HYSTERECTOMY Bilateral 07/30/2017   Procedure: HYSTERECTOMY TOTAL LAPAROSCOPIC BILATERAL SALPINGECTOMY;  Surgeon: Arloa Lamar SQUIBB, MD;  Location: [facility];  Service: Gynecology;  Laterality: Bilateral;   LEG SURGERY Left 2016   ankle was broken. 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;  Surgeon: Herminio Miu, MD;  Location: [facility];  Service: ENT;  Laterality: N/A;   TUBAL LIGATION  2007   WISDOM TOOTH EXTRACTION      Social History   Socioeconomic History   Marital status: Married    Spouse name: Not on file   Number of children: Not on file   Years of education: Not on file   Highest education level: Not on file  Occupational History   Not on file  Tobacco Use   Smoking status: Every Day    Current packs/day: 0.50    Average packs/day: 0.5  packs/day for 19.0 years (9.5 ttl pk-yrs)    Types: Cigarettes   Smokeless tobacco: Never   Tobacco comments:    Started smoking age 34  Vaping Use   Vaping status: Never Used  Substance and Sexual Activity   Alcohol use: No   Drug use: No    Comment: suboxone     Sexual activity: Yes    Birth control/protection: None  Other Topics Concern   Not on file  Social History Narrative   Not on file   Social Drivers of Health   Financial Resource Strain: Not on file  Food Insecurity: No Food Insecurity (04/11/2023)   Hunger Vital Sign    Worried About Running Out of Food in the Last Year: Never true    Ran Out of Food in the Last Year: Never true  Transportation Needs: No Transportation Needs (04/11/2023)   PRAPARE - Administrator, Civil Service (Medical): No    Lack of Transportation (Non-Medical): No  Physical Activity: Not on file  Stress: Not on file  Social Connections: Not on file  Intimate Partner Violence: Not At Risk (04/11/2023)   Humiliation, Afraid, Rape, and Kick questionnaire    Fear of Current or Ex-Partner: No    Emotionally Abused: No    Physically Abused: No    Sexually Abused: No    Family History  Problem Relation Age of Onset   Hypertension Mother    Cancer Mother    Hypertension Father    Diabetes Maternal Grandmother     Allergies  Allergen Reactions   Penicillins Diarrhea and Nausea And Vomiting    Has patient had a PCN reaction causing immediate rash, facial/tongue/throat swelling, SOB or lightheadedness with hypotension: yes Has patient had a PCN reaction causing severe rash involving mucus membranes or skin necrosis: no Has patient had a PCN reaction that required hospitalization: no Has patient had a PCN reaction occurring within the last 10 years: yes If all of the above answers are NO, then may proceed with Cephalosporin use.     SWELLING OF MOUTH/THROAT AND  Diarrhea, n & v    Hydrocodone  Hives and Rash   Toradol  [Ketorolac   Tromethamine ] Rash   Tramadol  Hives and Rash    Review of Systems  Constitutional:  Positive for chills, fever and malaise/fatigue.  HENT:  Positive for congestion.   Eyes:  Negative for blurred vision and pain.  Respiratory:  Positive for cough, sputum production, shortness of breath and wheezing.   Cardiovascular:  Negative for chest pain, palpitations, claudication and leg swelling.  Gastrointestinal:  Negative for abdominal pain, blood in stool, constipation, diarrhea, nausea and vomiting.  Genitourinary:  Negative for dysuria, frequency and urgency.  Musculoskeletal: Negative.   Skin: Negative.   Neurological:  Positive for headaches. Negative for dizziness, tingling and sensory change.  Endo/Heme/Allergies: Negative.   Psychiatric/Behavioral:  Positive for depression. The patient  is nervous/anxious and has insomnia.        Objective:   BP 122/88   Pulse 96   Ht 5' 1 (1.549 m)   Wt 181 lb 12.8 oz (82.5 kg)   LMP 06/04/2017 (Exact Date)   SpO2 96%   BMI 34.35 kg/m   Vitals:   06/10/24 0901  BP: 122/88  Pulse: 96  Height: 5' 1 (1.549 m)  Weight: 181 lb 12.8 oz (82.5 kg)  SpO2: 96%  BMI (Calculated): 34.37    Physical Exam Vitals and nursing note reviewed.  Constitutional:      Appearance: Normal appearance.  HENT:     Head: Normocephalic.     Nose: Congestion present.  Eyes:     Extraocular Movements: Extraocular movements intact.     Pupils: Pupils are equal, round, and reactive to light.  Cardiovascular:     Rate and Rhythm: Normal rate and regular rhythm.     Pulses: Normal pulses.     Heart sounds: Normal heart sounds. No murmur heard. Pulmonary:     Effort: Pulmonary effort is normal. No respiratory distress.     Breath sounds: Wheezing and rhonchi present.  Chest:     Chest wall: Tenderness present.  Abdominal:     General: There is no distension.     Tenderness: There is no abdominal tenderness.  Musculoskeletal:        General: No  tenderness. Normal range of motion.     Cervical back: Normal range of motion and neck supple.     Right lower leg: No edema.     Left lower leg: No edema.  Skin:    General: Skin is warm and dry.     Coloration: Skin is not jaundiced.     Findings: No erythema.  Neurological:     General: No focal deficit present.     Mental Status: She is alert and oriented to person, place, and time.  Psychiatric:        Mood and Affect: Mood normal.        Speech: Speech normal.        Behavior: Behavior is cooperative.        Cognition and Memory: Memory is not impaired.      No results found for any visits on 06/10/24.  Recent Results (from the past 2160 hours)  POCT rapid strep A     Status: None   Collection Time: 03/15/24  3:32 PM  Result Value Ref Range   Rapid Strep A Screen Negative Negative  POCT XPERT XPRESS SARS COVID-2/FLU/RSV     Status: None   Collection Time: 03/15/24  3:43 PM  Result Value Ref Range   SARS Coronavirus 2 Negative    FLU A Negative    FLU B Negative    RSV RNA, PCR Negative        Assessment & Plan B12 deficiency due to diet Vitamin D  deficiency, unspecified Other fatigue Hypothyroidism (acquired) - Check labs today - Supplementation recommended based off lab results and will notify patient at that time - Refills sent.  Essential hypertension, benign Prediabetes Mixed hyperlipidemia - Continue healthy diet and exercise as tolerated. - Check labs today   Breast cancer screening by mammogram - Ordered mammogram to begin screenings based off age. - Patient reports no family hx of breast cancer.  Mild persistent asthma with acute exacerbation - Continue using medications as prescribed. -Start albuterol inhaler or nebulizer every 6 hours as needed for chest tightness,  wheezing, shortness of breath  Nausea -Refill Zofran .  Anxiety and depression - Start Caplyta as prescribed. - Continue other medications as prescribed. - Will update  depression screening when she returns.  Pneumonia of both lower lobes due to infectious organism - Start Levaquin daily for 10 days. - Chest xray ordered.    Return in about 2 weeks (around 06/24/2024).   Total time spent: 25 minutes  Oddis DELENA Cain, FNP  06/10/2024   This document may have been prepared by [facility] Voice Recognition software and as such may include unintentional dictation errors.

## 2024-06-13 ENCOUNTER — Encounter: Payer: Self-pay | Admitting: Family

## 2024-06-13 NOTE — Assessment & Plan Note (Addendum)
 Refill Zofran

## 2024-06-13 NOTE — Assessment & Plan Note (Addendum)
-   Check labs today - Supplementation recommended based off lab results and will notify patient at that time - Refills sent.

## 2024-06-13 NOTE — Assessment & Plan Note (Addendum)
-   Ordered mammogram to begin screenings based off age. - Patient reports no family hx of breast cancer.

## 2024-06-13 NOTE — Assessment & Plan Note (Addendum)
-   Continue healthy diet and exercise as tolerated. - Check labs today

## 2024-06-13 NOTE — Assessment & Plan Note (Addendum)
-   Start Levaquin daily for 10 days. - Chest xray ordered.

## 2024-06-13 NOTE — Assessment & Plan Note (Addendum)
-   Start Caplyta as prescribed. - Continue other medications as prescribed. - Will update depression screening when she returns.

## 2024-06-13 NOTE — Assessment & Plan Note (Addendum)
-   Continue using medications as prescribed. -Start albuterol inhaler or nebulizer every 6 hours as needed for chest tightness, wheezing, shortness of breath

## 2024-06-24 ENCOUNTER — Ambulatory Visit: Admitting: Family

## 2024-06-27 ENCOUNTER — Other Ambulatory Visit: Payer: Self-pay | Admitting: Family

## 2024-06-27 ENCOUNTER — Ambulatory Visit: Admitting: Family

## 2024-06-29 MED ORDER — VYVANSE 20 MG PO CAPS: 20.0000 mg | ORAL_CAPSULE | Freq: Every day | ORAL | 0 refills | Status: DC

## 2024-07-04 ENCOUNTER — Ambulatory Visit: Admitting: Family

## 2024-07-08 ENCOUNTER — Other Ambulatory Visit: Payer: Self-pay | Admitting: Family

## 2024-07-11 ENCOUNTER — Encounter: Payer: Self-pay | Admitting: Cardiology

## 2024-07-11 ENCOUNTER — Ambulatory Visit: Admitting: Cardiology

## 2024-07-11 VITALS — BP 122/68 | HR 80 | Ht 61.0 in | Wt 185.0 lb

## 2024-07-11 DIAGNOSIS — Z131 Encounter for screening for diabetes mellitus: Secondary | ICD-10-CM

## 2024-07-11 DIAGNOSIS — F909 Attention-deficit hyperactivity disorder, unspecified type: Secondary | ICD-10-CM

## 2024-07-11 DIAGNOSIS — Q385 Congenital malformations of palate, not elsewhere classified: Secondary | ICD-10-CM | POA: Diagnosis not present

## 2024-07-11 DIAGNOSIS — I10 Essential (primary) hypertension: Secondary | ICD-10-CM | POA: Diagnosis not present

## 2024-07-11 MED ORDER — LIDOCAINE VISCOUS HCL 2 % MT SOLN: 15.0000 mL | OROMUCOSAL | 1 refills | Status: AC | PRN

## 2024-07-11 NOTE — Progress Notes (Signed)
 Established Patient Office Visit  Subjective:  Patient ID: Regina Mays, female    DOB: 01-15-1986  Age: 37 y.o. MRN: [id number]  Chief Complaint  Patient presents with   Acute Visit    Sinus Congestion since having pneumonia in september    Patient in office for an acute visit, complaining of sinus congestion. Patient reports congestion is not new, she wanted an appointment to get refills on her Adderall and her lidocaine  solution. Notified patient both prescriptions have been sent to her regular provider for approval. Patient upset, left exam room prior to visit ending.  Blood pressure normal today.     No other concerns at this time.   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  In rehab   Left ankle pain    Left ankle swelling    Long term current use of systemic steroids 06/09/2022   Long-term use of immunosuppressant medication 06/09/2022   Neuropathic pain of left lower extremity 04/16/2023   Open wound of elbow 04/16/2023   Panic attacks    Recurrent major depressive disorder, in remission 06/20/2015   Sepsis due to undetermined organism (HCC) 04/11/2023   Wears dentures    partial upper    Past Surgical History:  Procedure Laterality Date   CYSTOSCOPY N/A 07/30/2017   Procedure: CYSTOSCOPY;  Surgeon: Arloa Lamar SQUIBB, MD;  Location: [facility];  Service: Gynecology;  Laterality: N/A;   LAPAROSCOPIC HYSTERECTOMY Bilateral 07/30/2017   Procedure: HYSTERECTOMY TOTAL LAPAROSCOPIC BILATERAL SALPINGECTOMY;  Surgeon: Arloa Lamar SQUIBB, MD;  Location: [facility];  Service: Gynecology;  Laterality: Bilateral;   LEG SURGERY Left 2016   ankle was broken. 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;  Surgeon: Herminio Miu, MD;  Location: [facility];  Service: ENT;  Laterality: N/A;   TUBAL LIGATION  2007   WISDOM TOOTH EXTRACTION      Social History   Socioeconomic History   Marital status: Married    Spouse name: Not on file   Number of children: Not on file   Years of education: Not on file   Highest education level: Not on file  Occupational History   Not on file  Tobacco Use   Smoking status: Every Day    Current packs/day: 0.50    Average packs/day: 0.5 packs/day for 19.0 years (9.5 ttl pk-yrs)    Types: Cigarettes   Smokeless tobacco: Never   Tobacco comments:    Started smoking age 19  Vaping Use   Vaping status: Never Used  Substance and Sexual Activity   Alcohol use: No   Drug use: No    Comment: suboxone     Sexual activity: Yes    Birth control/protection: None  Other  Topics Concern   Not on file  Social History Narrative   Not on file   Social Drivers of Health   Financial Resource Strain: Not on file  Food Insecurity: No Food Insecurity (04/11/2023)   Hunger Vital Sign    Worried About Running Out of Food in the Last Year: Never true    Ran Out of Food in the Last Year: Never true  Transportation Needs: No Transportation Needs (04/11/2023)   PRAPARE - Administrator, Civil Service (Medical): No    Lack of Transportation (Non-Medical): No  Physical Activity: Not on file  Stress: Not on file  Social Connections: Not on file  Intimate Partner Violence: Not At Risk (04/11/2023)   Humiliation,  Afraid, Rape, and Kick questionnaire    Fear of Current or Ex-Partner: No    Emotionally Abused: No    Physically Abused: No    Sexually Abused: No    Family History  Problem Relation Age of Onset   Hypertension Mother    Cancer Mother    Hypertension Father    Diabetes Maternal Grandmother     Allergies  Allergen Reactions   Penicillins Diarrhea and Nausea And Vomiting    Has patient had a PCN reaction causing immediate rash, facial/tongue/throat swelling, SOB or lightheadedness with hypotension: yes Has patient had a PCN reaction causing severe rash involving mucus membranes or skin necrosis: no Has patient had a PCN reaction that required hospitalization: no Has patient had a PCN reaction occurring within the last 10 years: yes If all of the above answers are NO, then may proceed with Cephalosporin use.     SWELLING OF MOUTH/THROAT AND  Diarrhea, n & v    Hydrocodone  Hives and Rash   Toradol  [Ketorolac  Tromethamine ] Rash   Tramadol  Hives and Rash    Outpatient Medications Prior to Visit  Medication Sig   albuterol  (PROVENTIL ) (2.5 MG/3ML) 0.083% nebulizer solution Take 3 mLs (2.5 mg total) by nebulization every 4 (four) hours as needed for wheezing or shortness of breath.   albuterol  (VENTOLIN  HFA) 108 (90 Base) MCG/ACT inhaler Inhale 2 puffs into the lungs every 6 (six) hours as needed for wheezing or shortness of breath.   amphetamine -dextroamphetamine  (ADDERALL) 10 MG tablet Take 1 tablet (10 mg total) by mouth daily with lunch.   buPROPion  (WELLBUTRIN  XL) 300 MG 24 hr tablet Take 1 tablet (300 mg total) by mouth daily.   Calcium Carb-Cholecalciferol 600-10 MG-MCG TABS Take by mouth.   clonazePAM (KLONOPIN) 0.5 MG tablet Take 0.5 mg by mouth 2 (two) times daily as needed.   gabapentin (NEURONTIN) 300 MG capsule Take 600 mg by mouth 3 (three) times daily.   Lumateperone  Tosylate (CAPLYTA ) 21 MG CAPS Take 1 tablet (21 mg) by mouth at bedtime for 1 week; then increase to 2  tablets (42 mg) at bedtime.   methocarbamol  (ROBAXIN ) 500 MG tablet Take 1 tablet (500 mg total) by mouth every 8 (eight) hours as needed for muscle spasms.   nystatin  (MYCOSTATIN ) 100000 UNIT/ML suspension Take 5 mLs (500,000 Units total) by mouth 4 (four) times daily.   ondansetron  (ZOFRAN ) 4 MG tablet Take 1 tablet (4 mg total) by mouth 2 (two) times daily.   Semaglutide -Weight Management (WEGOVY ) 1.7 MG/0.75ML SOAJ Inject 1.7 mg into the skin once a week.   SUBOXONE  8-2 MG FILM Place 1 Film under the tongue 3 (three) times daily.   Vitamin D , Ergocalciferol , (DRISDOL ) 1.25 MG (50000 UNIT) CAPS capsule Take 1 capsule (  50,000 Units total) by mouth every 7 (seven) days.   VYVANSE  20 MG capsule Take 1 capsule (20 mg total) by mouth daily.   VYVANSE  60 MG capsule Take 1 capsule (60 mg total) by mouth every morning.   [DISCONTINUED] lidocaine  (XYLOCAINE ) 2 % solution Use as directed 15 mLs in the mouth or throat every 4 (four) hours as needed for mouth pain.   [DISCONTINUED] metroNIDAZOLE  (FLAGYL ) 500 MG tablet Take 1 tablet (500 mg total) by mouth 3 (three) times daily. (Patient not taking: Reported on 06/10/2024)   [DISCONTINUED] predniSONE (DELTASONE) 5 MG tablet Take 5 mg by mouth daily with breakfast. (Patient not taking: Reported on 06/10/2024)   No facility-administered medications prior to visit.    Review of Systems  Constitutional: Negative.   HENT: Negative.    Eyes: Negative.   Respiratory: Negative.    Cardiovascular: Negative.  Negative for chest pain.  Gastrointestinal: Negative.  Negative for abdominal pain, constipation and diarrhea.  Genitourinary: Negative.   Musculoskeletal:  Negative for joint pain and myalgias.  Skin: Negative.   Neurological: Negative.  Negative for dizziness.  Endo/Heme/Allergies: Negative.   All other systems reviewed and are negative.      Objective:   BP 122/68   Pulse 80   Ht 5' 1 (1.549 m)   Wt 185 lb (83.9 kg)   LMP 06/04/2017 (Exact  Date)   SpO2 97%   BMI 34.96 kg/m   Vitals:   07/11/24 1132  BP: 122/68  Pulse: 80  Height: 5' 1 (1.549 m)  Weight: 185 lb (83.9 kg)  SpO2: 97%  BMI (Calculated): 34.97    Physical Exam Vitals and nursing note reviewed.  Constitutional:      Appearance: Normal appearance. She is normal weight.  HENT:     Head: Normocephalic and atraumatic.     Nose: Nose normal.     Mouth/Throat:     Mouth: Mucous membranes are moist.  Eyes:     Extraocular Movements: Extraocular movements intact.     Conjunctiva/sclera: Conjunctivae normal.     Pupils: Pupils are equal, round, and reactive to light.  Cardiovascular:     Rate and Rhythm: Normal rate and regular rhythm.     Pulses: Normal pulses.     Heart sounds: Normal heart sounds.  Pulmonary:     Effort: Pulmonary effort is normal.     Breath sounds: Normal breath sounds.  Abdominal:     General: Abdomen is flat. Bowel sounds are normal.     Palpations: Abdomen is soft.  Musculoskeletal:        General: Normal range of motion.     Cervical back: Normal range of motion.  Skin:    General: Skin is warm and dry.  Neurological:     General: No focal deficit present.     Mental Status: She is alert and oriented to person, place, and time.  Psychiatric:        Mood and Affect: Mood normal.        Behavior: Behavior normal.        Thought Content: Thought content normal.        Judgment: Judgment normal.      No results found for any visits on 07/11/24.  No results found for this or any previous visit (from the past 2160 hours).    Assessment & Plan:  Continue same medications  Problem List Items Addressed This Visit       Cardiovascular and Mediastinum  Essential hypertension, benign - Primary     Other   Adult ADHD (Chronic)   Abnormality of palate    Return in about 2 weeks (around 07/25/2024) for with Alan.   Total time spent: 25 minutes. This time includes review of previous notes and results and  patient face to face interaction during today's visit.    Jeoffrey Pollen, NP  07/11/2024   This document may have been prepared by [facility] Voice Recognition software and as such may include unintentional dictation errors.

## 2024-07-13 MED ORDER — AMPHETAMINE-DEXTROAMPHETAMINE 10 MG PO TABS: 10.0000 mg | ORAL_TABLET | Freq: Every day | ORAL | 0 refills | Status: DC

## 2024-07-20 ENCOUNTER — Other Ambulatory Visit: Payer: Self-pay

## 2024-07-21 ENCOUNTER — Telehealth: Payer: Self-pay | Admitting: Family

## 2024-07-21 NOTE — Telephone Encounter (Signed)
 Patient left VM stating she has questions about 2 of her meds and would like a call back.

## 2024-07-22 NOTE — Telephone Encounter (Signed)
 Patient called again and left VM asking if the meds she requested yesterday were sent in or not. Please advise.

## 2024-07-26 ENCOUNTER — Other Ambulatory Visit: Payer: Self-pay

## 2024-07-26 MED ORDER — DOXYCYCLINE HYCLATE 100 MG PO TABS: 100.0000 mg | ORAL_TABLET | Freq: Two times a day (BID) | ORAL | 0 refills | Status: AC

## 2024-07-26 MED ORDER — VYVANSE 50 MG PO CAPS: 50.0000 mg | ORAL_CAPSULE | Freq: Every day | ORAL | 0 refills | Status: DC

## 2024-07-27 NOTE — Telephone Encounter (Signed)
 VM full

## 2024-07-29 NOTE — Telephone Encounter (Signed)
 Line cut out

## 2024-08-05 ENCOUNTER — Ambulatory Visit: Admitting: Family

## 2024-08-05 ENCOUNTER — Encounter: Payer: Self-pay | Admitting: Family

## 2024-08-05 MED ORDER — ATOMOXETINE HCL 40 MG PO CAPS: 40.0000 mg | ORAL_CAPSULE | Freq: Two times a day (BID) | ORAL | 2 refills | Status: AC

## 2024-08-05 MED ORDER — QVAR REDIHALER 40 MCG/ACT IN AERB: 1.0000 | INHALATION_SPRAY | Freq: Two times a day (BID) | RESPIRATORY_TRACT | 1 refills | Status: AC

## 2024-08-05 NOTE — Progress Notes (Unsigned)
 "  Established Patient Office Visit  Subjective:  Patient ID: Regina Mays, female    DOB: Sep 29, 1985  Age: 39 y.o. MRN: [id number]  Chief Complaint  Patient presents with   Follow-up    Medication refills    HPI  No other concerns at this time.   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  In rehab   Left ankle pain    Left ankle swelling    Long term current use of systemic steroids 06/09/2022   Long-term use of immunosuppressant medication 06/09/2022   Neuropathic pain of left lower extremity 04/16/2023   Open wound of elbow 04/16/2023   Panic attacks    Recurrent major depressive disorder, in remission 06/20/2015   Sepsis due to undetermined organism (HCC) 04/11/2023   Wears dentures    partial upper    Past Surgical History:  Procedure Laterality Date   CYSTOSCOPY N/A 07/30/2017   Procedure: CYSTOSCOPY;  Surgeon: Arloa Lamar SQUIBB, MD;  Location: [facility];  Service: Gynecology;  Laterality: N/A;   LAPAROSCOPIC HYSTERECTOMY Bilateral 07/30/2017   Procedure: HYSTERECTOMY TOTAL LAPAROSCOPIC BILATERAL SALPINGECTOMY;  Surgeon: Arloa Lamar SQUIBB, MD;  Location: [facility];  Service: Gynecology;  Laterality: Bilateral;   LEG SURGERY Left 2016   ankle was broken. 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;  Surgeon: Herminio Miu, MD;  Location: [facility];  Service: ENT;  Laterality: N/A;   TUBAL LIGATION  2007   WISDOM TOOTH EXTRACTION      Social History   Socioeconomic History   Marital status: Married    Spouse name: Not on file   Number of children: Not on file   Years of education: Not on file   Highest education level: Not on file  Occupational History   Not on file  Tobacco Use   Smoking status: Every Day    Current packs/day: 0.50    Average packs/day: 0.5 packs/day for 19.0 years (9.5 ttl pk-yrs)    Types: Cigarettes   Smokeless tobacco: Never   Tobacco comments:    Started smoking age 2  Vaping Use   Vaping status: Never Used  Substance and Sexual Activity   Alcohol use: No   Drug use: No    Comment: suboxone     Sexual activity: Yes    Birth control/protection: None  Other Topics Concern   Not on file  Social History Narrative   Not on file   Social Drivers of Health   Tobacco Use: High Risk (08/05/2024)   Patient History    Smoking Tobacco Use: Every Day    Smokeless Tobacco Use: Never    Passive Exposure: Not on file  Financial Resource Strain: Not on file  Food  Insecurity: No Food Insecurity (04/11/2023)   Hunger Vital Sign    Worried About Running Out of Food in the Last Year: Never true    Ran Out of Food in the Last Year: Never true  Transportation Needs: No Transportation Needs (04/11/2023)   PRAPARE - Administrator, Civil Service (Medical): No    Lack of Transportation (Non-Medical): No  Physical Activity: Not on file  Stress: Not on file  Social Connections: Not on file  Intimate Partner Violence: Not At Risk (04/11/2023)   Humiliation, Afraid, Rape, and Kick questionnaire    Fear of Current or Ex-Partner: No    Emotionally Abused: No    Physically Abused: No    Sexually Abused: No  Depression (PHQ2-9): Not on file  Alcohol Screen: Not on file  Housing: Low Risk  (04/11/2023)   Housing    Last Housing Risk Score: 0  Utilities: Not At Risk (04/11/2023)   [facility] Utilities    Threatened with loss of utilities: No  Health Literacy: Not on file    Family History  Problem Relation Age of Onset   Hypertension Mother    Cancer Mother    Hypertension Father    Diabetes Maternal Grandmother     Allergies[1]  Review of Systems  All other systems reviewed and are negative.      Objective:   BP 138/86   Pulse 89   Ht 5' 1 (1.549 m)   Wt 193 lb 3.2 oz (87.6 kg)   LMP 06/05/2017   SpO2 98%   BMI 36.50 kg/m   Vitals:   08/05/24 1311  BP: 138/86  Pulse: 89  Height: 5' 1 (1.549 m)  Weight: 193 lb 3.2 oz (87.6 kg)  SpO2: 98%  BMI (Calculated): 36.52    Physical Exam Vitals and nursing note reviewed.  Constitutional:      Appearance: Normal appearance. She is normal weight.  HENT:     Head: Normocephalic.  Eyes:     Extraocular Movements: Extraocular movements intact.     Conjunctiva/sclera: Conjunctivae normal.     Pupils: Pupils are equal, round, and reactive to light.  Cardiovascular:     Rate and Rhythm: Normal rate.  Pulmonary:     Effort: Pulmonary effort is normal.  Neurological:     General: No focal deficit present.     Mental Status: She is alert and oriented to person, place, and time. Mental status is at baseline.  Psychiatric:        Mood and Affect: Mood normal.        Behavior: Behavior normal.        Thought Content: Thought content normal.      No results found for any visits on 08/05/24.  No results found for this or any previous visit (from the past 2160 hours).     Assessment & Plan:   Assessment & Plan     No follow-ups on file.   Total time spent: {AMA time spent:29001} minutes  Regina DECLERCQ, FNP  08/05/2024   This document may have been prepared by [facility] Voice Recognition software and as such may include unintentional dictation errors.     [1]  Allergies Allergen Reactions    Penicillins Diarrhea and Nausea And Vomiting    Has patient had a PCN reaction causing immediate rash, facial/tongue/throat swelling, SOB or lightheadedness with hypotension: yes Has patient had a PCN reaction causing severe rash involving mucus membranes or skin necrosis: no Has patient had  a PCN reaction that required hospitalization: no Has patient had a PCN reaction occurring within the last 10 years: yes If all of the above answers are NO, then may proceed with Cephalosporin use.     SWELLING OF MOUTH/THROAT AND  Diarrhea, n & v    Hydrocodone  Hives and Rash   Toradol  [Ketorolac  Tromethamine ] Rash   Tramadol  Hives and Rash   "

## 2024-08-10 ENCOUNTER — Telehealth: Payer: Self-pay | Admitting: Family

## 2024-08-10 NOTE — Telephone Encounter (Signed)
 Patient left VM wanting a call back about her medication.    If this is about her atomoxetine Regina Mays, I have not received anything stating it needs a PA.

## 2024-08-11 NOTE — Telephone Encounter (Signed)
 Vm box full

## 2024-08-17 ENCOUNTER — Encounter: Payer: Self-pay | Admitting: Family

## 2024-08-17 ENCOUNTER — Encounter: Admitting: Family

## 2024-08-17 NOTE — Progress Notes (Signed)
 This encounter was created in error - please disregard.

## 2024-08-26 ENCOUNTER — Encounter: Payer: Self-pay | Admitting: Family

## 2024-08-26 ENCOUNTER — Ambulatory Visit: Admitting: Family

## 2024-09-01 ENCOUNTER — Other Ambulatory Visit: Payer: Self-pay | Admitting: Family

## 2024-09-02 MED ORDER — AMPHETAMINE-DEXTROAMPHETAMINE 10 MG PO TABS: 10.0000 mg | ORAL_TABLET | Freq: Every day | ORAL | 0 refills | Status: AC

## 2024-09-02 MED ORDER — VYVANSE 50 MG PO CAPS: 50.0000 mg | ORAL_CAPSULE | Freq: Every day | ORAL | 0 refills | Status: AC

## 2024-09-26 ENCOUNTER — Ambulatory Visit: Admitting: Family

## 2024-11-03 ENCOUNTER — Ambulatory Visit: Admitting: Family
